# Patient Record
Sex: Female | Born: 1950 | Hispanic: No | Marital: Married | Smoking: Never smoker
Health system: Southern US, Community
[De-identification: ages and names within clinical notes are randomized; demographics above are authoritative.]

## PROBLEM LIST (undated history)

## (undated) DIAGNOSIS — E785 Hyperlipidemia, unspecified: Secondary | ICD-10-CM

## (undated) DIAGNOSIS — T7840XA Allergy, unspecified, initial encounter: Secondary | ICD-10-CM

## (undated) DIAGNOSIS — I10 Essential (primary) hypertension: Secondary | ICD-10-CM

## (undated) DIAGNOSIS — M1990 Unspecified osteoarthritis, unspecified site: Secondary | ICD-10-CM

## (undated) DIAGNOSIS — K219 Gastro-esophageal reflux disease without esophagitis: Secondary | ICD-10-CM

## (undated) HISTORY — DX: Hyperlipidemia, unspecified: E78.5

## (undated) HISTORY — DX: Allergy, unspecified, initial encounter: T78.40XA

## (undated) HISTORY — PX: TONSILLECTOMY: SUR1361

## (undated) HISTORY — PX: APPENDECTOMY: SHX54

## (undated) HISTORY — DX: Unspecified osteoarthritis, unspecified site: M19.90

## (undated) HISTORY — DX: Gastro-esophageal reflux disease without esophagitis: K21.9

## (undated) HISTORY — DX: Essential (primary) hypertension: I10

---

## 1972-12-18 HISTORY — PX: APPENDECTOMY: SHX54

## 1975-12-19 HISTORY — PX: TONSILLECTOMY: SUR1361

## 2016-01-22 ENCOUNTER — Emergency Department
Admission: EM | Admit: 2016-01-22 | Discharge: 2016-01-22 | Disposition: A | Discharge status: Home / Self Care | Payer: Managed Care, Other (non HMO) | Attending: Emergency Medicine | Admitting: Emergency Medicine

## 2016-01-22 ENCOUNTER — Emergency Department: Payer: Managed Care, Other (non HMO)

## 2016-01-22 ENCOUNTER — Encounter: Payer: Self-pay | Admitting: Emergency Medicine

## 2016-01-22 DIAGNOSIS — J069 Acute upper respiratory infection, unspecified: Secondary | ICD-10-CM

## 2016-01-22 DIAGNOSIS — J029 Acute pharyngitis, unspecified: Secondary | ICD-10-CM | POA: Diagnosis present

## 2016-01-22 DIAGNOSIS — Z880 Allergy status to penicillin: Secondary | ICD-10-CM | POA: Diagnosis not present

## 2016-01-22 LAB — POCT RAPID STREP A: Streptococcus, Group A Screen (Direct): NEGATIVE

## 2016-01-22 LAB — RAPID INFLUENZA A&B ANTIGENS
Influenza A (ARMC): NOT DETECTED
Influenza B (ARMC): NOT DETECTED

## 2016-01-22 MED ORDER — OSELTAMIVIR PHOSPHATE 75 MG PO CAPS: 75.0000 mg | ORAL_CAPSULE | Freq: Two times a day (BID) | ORAL | Status: DC

## 2016-01-22 MED ORDER — ACETAMINOPHEN 325 MG PO TABS
ORAL_TABLET | ORAL | Status: AC
  Administered 2016-01-22: 650 mg via ORAL
  Filled 2016-01-22: qty 2

## 2016-01-22 MED ORDER — ACETAMINOPHEN 325 MG PO TABS
650.0000 mg | ORAL_TABLET | Freq: Once | ORAL | Status: AC | PRN
  Administered 2016-01-22: 650 mg via ORAL

## 2016-01-22 MED ORDER — AZITHROMYCIN 250 MG PO TABS
ORAL_TABLET | ORAL | Status: DC
Start: 2016-01-22 — End: 2020-12-08

## 2016-01-22 NOTE — ED Notes (Signed)
Patient presents to the ED with fever, cough, congestion and body aches x 2 days.  Patient states she had the flu about 2-3 weeks ago and had recovered but is now experiencing the same symptoms she was before.  Patient is alert and oriented at this time.  Ambulatory to triage.  Respirations are even and nonlabored.  Patient is complaining of painful cough.

## 2016-01-22 NOTE — ED Provider Notes (Signed)
[facility] Emergency Department Provider Note  ____________________________________________  Time seen: Approximately 8:06 PM  I have reviewed the triage vital signs and the nursing notes.   HISTORY  Chief Complaint Influenza    HPI Amber Burns is a 65 y.o. female presents with acute onset of sore throat cough, myalgias and fevers. She had a similar infection approximately 3 weeks ago that resolved with antibiotics. However her symptoms currently appear worse to the patient. She has some shortness of breath and is fatigued. No abdominal pain or vomiting. She has a headache. No history of smoking, lung disease or asthma.   History reviewed. No pertinent past medical history.  There are no active problems to display for this patient.   Past Surgical History  Procedure Laterality Date  . Tonsillectomy    . Appendectomy      Current Outpatient Rx  Name  Route  Sig  Dispense  Refill  . azithromycin (ZITHROMAX Z-PAK) 250 MG tablet      Take 2 tablets (500 mg) on  Day 1,  followed by 1 tablet (250 mg) once daily on Days 2 through 5.   6 each   0   . oseltamivir (TAMIFLU) 75 MG capsule   Oral   Take 1 capsule (75 mg total) by mouth 2 (two) times daily.   10 capsule   0     Allergies Aspirin; Ibuprofen; and Penicillins  No family history on file.  Social History Social History  Substance Use Topics  . Smoking status: Never Smoker   . Smokeless tobacco: None  . Alcohol Use: No    Review of Systems Constitutional: No fever/chills Eyes: No visual changes. ENT: PER HPI Cardiovascular: Denies chest pain. Respiratory: MILD shortness of breath. Gastrointestinal: No abdominal pain.  No nausea, no vomiting.  No diarrhea.  No constipation. Genitourinary: Negative for dysuria. Musculoskeletal: Negative for back pain. Skin: Negative for rash. Neurological: Negative for focal weakness or numbness. 10-point ROS otherwise  negative.  ____________________________________________   PHYSICAL EXAM:  VITAL SIGNS: ED Triage Vitals  Enc Vitals Group     BP 01/22/16 1907 145/74 mmHg     Pulse Rate 01/22/16 1907 91     Resp 01/22/16 1907 20     Temp 01/22/16 1907 100.2 F (37.9 C)     Temp Source 01/22/16 1907 Oral     SpO2 01/22/16 1907 95 %     Weight 01/22/16 1850 180 lb (81.647 kg)     Height 01/22/16 1850  (1.702 m)     Head Cir --      Peak Flow --      Pain Score 01/22/16 1850 7     Pain Loc --      Pain Edu? --      Excl. in GC? --     Constitutional: Alert and oriented. Well appearing and in no acute distress. Eyes: Conjunctivae are normal. PERRL. EOMI. Ears:  Clear with normal landmarks. No erythema. Head: Atraumatic. Nose: No congestion/rhinnorhea. Mouth/Throat: Mucous membranes are moist.  Oropharynx non-erythematous. No lesions. Neck:  Supple.  No adenopathy.   Cardiovascular: Normal rate, regular rhythm. Grossly normal heart sounds.  Good peripheral circulation. Respiratory: Normal respiratory effort.  No retractions. Lungs CTAB. Gastrointestinal: Soft and nontender. No distention. No abdominal bruits. No CVA tenderness. Musculoskeletal: Nml ROM of upper and lower extremity joints. Neurologic:  Normal speech and language. No gross focal neurologic deficits are appreciated. No gait instability. Skin:  Skin is warm, dry  and intact. No rash noted. Psychiatric: Mood and affect are normal. Speech and behavior are normal.  ____________________________________________   LABS (all labs ordered are listed, but only abnormal results are displayed)  Labs Reviewed  RAPID INFLUENZA A&B ANTIGENS ([facility] ONLY)  POCT RAPID STREP A   ____________________________________________  EKG   ____________________________________________  RADIOLOGY  CLINICAL DATA: Patient presents to the ED with fever, cough, congestion and body aches x 2 days. Patient states she had the flu about 2-3 weeks  ago and had recovered but is now experiencing the same symptoms she was before. Cough.  EXAM: CHEST 2 VIEW  COMPARISON: None.  FINDINGS: The heart size and mediastinal contours are within normal limits. Both lungs are clear. The visualized skeletal structures are unremarkable.  IMPRESSION: No active cardiopulmonary disease.   Electronically Signed  By: Norva Pavlov M.D.  On: 01/22/2016 20:16  ____________________________________________   PROCEDURES  Procedure(s) performed: None  Critical Care performed: No  ____________________________________________   INITIAL IMPRESSION / ASSESSMENT AND PLAN / ED COURSE  Pertinent labs & imaging results that were available during my care of the patient were reviewed by me and considered in my medical decision making (see chart for details).  65 year old female with acute onset of respiratory symptoms consistent with a viral URI.    negative flu test and chest x-ray. However I did discuss taking Tamiflu with the patient because of a possibility still of flu virus. She is also given Zithromax to take if symptoms are worsening after 5-7 days. She may return to emergency room for any concerns. ____________________________________________   FINAL CLINICAL IMPRESSION(S) / ED DIAGNOSES  Final diagnoses:  URI (upper respiratory infection)      Ignacia Bayley, PA-C 01/22/16 2119  Sharyn Creamer, MD 01/22/16 2250

## 2016-01-22 NOTE — Discharge Instructions (Signed)
Upper Respiratory Infection, Adult Most upper respiratory infections (URIs) are a viral infection of the air passages leading to the lungs. A URI affects the nose, throat, and upper air passages. The most common type of URI is nasopharyngitis and is typically referred to as "the common cold." URIs run their course and usually go away on their own. Most of the time, a URI does not require medical attention, but sometimes a bacterial infection in the upper airways can follow a viral infection. This is called a secondary infection. Sinus and middle ear infections are common types of secondary upper respiratory infections. Bacterial pneumonia can also complicate a URI. A URI can worsen asthma and chronic obstructive pulmonary disease (COPD). Sometimes, these complications can require emergency medical care and may be life threatening.  CAUSES Almost all URIs are caused by viruses. A virus is a type of germ and can spread from one person to another.  RISKS FACTORS You may be at risk for a URI if:   You smoke.   You have chronic heart or lung disease.  You have a weakened defense (immune) system.   You are very young or very old.   You have nasal allergies or asthma.  You work in crowded or poorly ventilated areas.  You work in health care facilities or schools. SIGNS AND SYMPTOMS  Symptoms typically develop 2-3 days after you come in contact with a cold virus. Most viral URIs last 7-10 days. However, viral URIs from the influenza virus (flu virus) can last 14-18 days and are typically more severe. Symptoms may include:   Runny or stuffy (congested) nose.   Sneezing.   Cough.   Sore throat.   Headache.   Fatigue.   Fever.   Loss of appetite.   Pain in your forehead, behind your eyes, and over your cheekbones (sinus pain).  Muscle aches.  DIAGNOSIS  Your health care provider may diagnose a URI by:  Physical exam.  Tests to check that your symptoms are not due to  another condition such as:  Strep throat.  Sinusitis.  Pneumonia.  Asthma. TREATMENT  A URI goes away on its own with time. It cannot be cured with medicines, but medicines may be prescribed or recommended to relieve symptoms. Medicines may help:  Reduce your fever.  Reduce your cough.  Relieve nasal congestion. HOME CARE INSTRUCTIONS   Take medicines only as directed by your health care provider.   Gargle warm saltwater or take cough drops to comfort your throat as directed by your health care provider.  Use a warm mist humidifier or inhale steam from a shower to increase air moisture. This may make it easier to breathe.  Drink enough fluid to keep your urine clear or pale yellow.   Eat soups and other clear broths and maintain good nutrition.   Rest as needed.   Return to work when your temperature has returned to normal or as your health care provider advises. You may need to stay home longer to avoid infecting others. You can also use a face mask and careful hand washing to prevent spread of the virus.  Increase the usage of your inhaler if you have asthma.   Do not use any tobacco products, including cigarettes, chewing tobacco, or electronic cigarettes. If you need help quitting, ask your health care provider. PREVENTION  The best way to protect yourself from getting a cold is to practice good hygiene.   Avoid oral or hand contact with people with cold  symptoms.   Wash your hands often if contact occurs.  There is no clear evidence that vitamin C, vitamin E, echinacea, or exercise reduces the chance of developing a cold. However, it is always recommended to get plenty of rest, exercise, and practice good nutrition.  SEEK MEDICAL CARE IF:   You are getting worse rather than better.   Your symptoms are not controlled by medicine.   You have chills.  You have worsening shortness of breath.  You have brown or red mucus.  You have yellow or brown nasal  discharge.  You have pain in your face, especially when you bend forward.  You have a fever.  You have swollen neck glands.  You have pain while swallowing.  You have white areas in the back of your throat. SEEK IMMEDIATE MEDICAL CARE IF:   You have severe or persistent:  Headache.  Ear pain.  Sinus pain.  Chest pain.  You have chronic lung disease and any of the following:  Wheezing.  Prolonged cough.  Coughing up blood.  A change in your usual mucus.  You have a stiff neck.  You have changes in your:  Vision.  Hearing.  Thinking.  Mood. MAKE SURE YOU:   Understand these instructions.  Will watch your condition.  Will get help right away if you are not doing well or get worse.   This information is not intended to replace advice given to you by your health care provider. Make sure you discuss any questions you have with your health care provider.   Document Released: 05/30/2001 Document Revised: 04/20/2015 Document Reviewed: 03/11/2014 Elsevier Interactive Patient Education 2016 [employer].   You have symptoms consistent with a viral respiratory infection. This still may be a flu virus and you may benefit from Tamiflu. If symptoms are worsening over the next 5-7 days he may try an antibiotic as well. Feel free to return to the emergency room for any concerns, or follow-up with your physician.

## 2016-11-21 ENCOUNTER — Other Ambulatory Visit: Payer: Self-pay | Admitting: Physician Assistant

## 2016-11-21 DIAGNOSIS — R928 Other abnormal and inconclusive findings on diagnostic imaging of breast: Secondary | ICD-10-CM

## 2016-11-22 ENCOUNTER — Inpatient Hospital Stay
Admission: RE | Admit: 2016-11-22 | Discharge: 2016-11-22 | Disposition: A | Discharge status: Home / Self Care | Payer: Self-pay | Source: Ambulatory Visit | Attending: *Deleted | Admitting: *Deleted

## 2016-11-22 ENCOUNTER — Other Ambulatory Visit: Payer: Self-pay | Admitting: *Deleted

## 2016-11-22 DIAGNOSIS — Z9289 Personal history of other medical treatment: Secondary | ICD-10-CM

## 2016-11-23 ENCOUNTER — Other Ambulatory Visit: Payer: Self-pay | Admitting: Physician Assistant

## 2016-11-23 DIAGNOSIS — R928 Other abnormal and inconclusive findings on diagnostic imaging of breast: Secondary | ICD-10-CM

## 2017-12-03 IMAGING — CR DG CHEST 2V
1 series · 2 of 2 positions shown · non-contrast
Comparison: None.

CLINICAL DATA: Patient presents to the ED with fever, cough,
congestion and body aches x 2 days. Patient states she had the flu
about 2-3 weeks ago and had recovered but is now experiencing the
same symptoms she was before. Cough.

EXAM:
CHEST  2 VIEW

[Series 1: dg chest 2 view · 0.14mm/px · 2 of 2 slices shown]
[im 1/2]
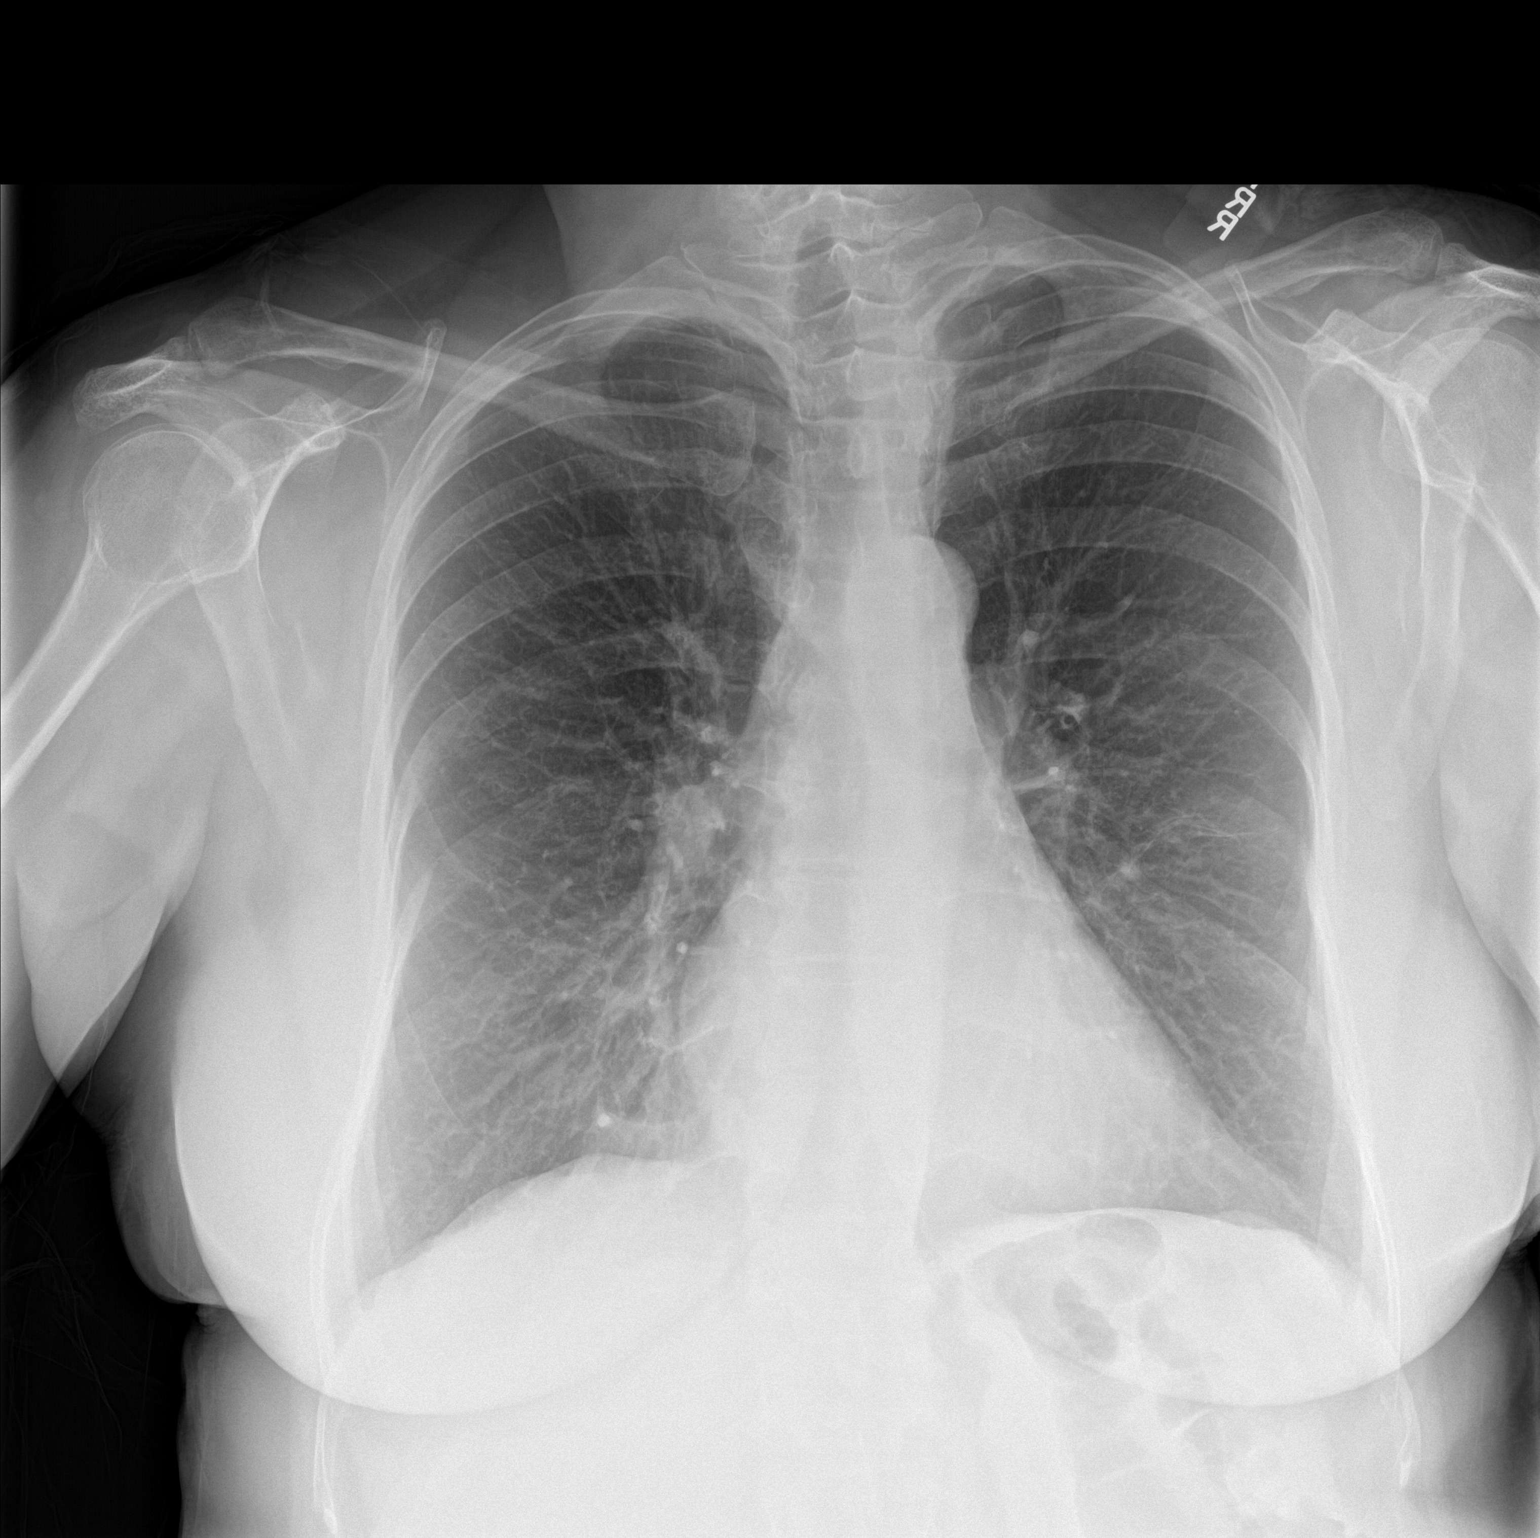
[im 2/2]
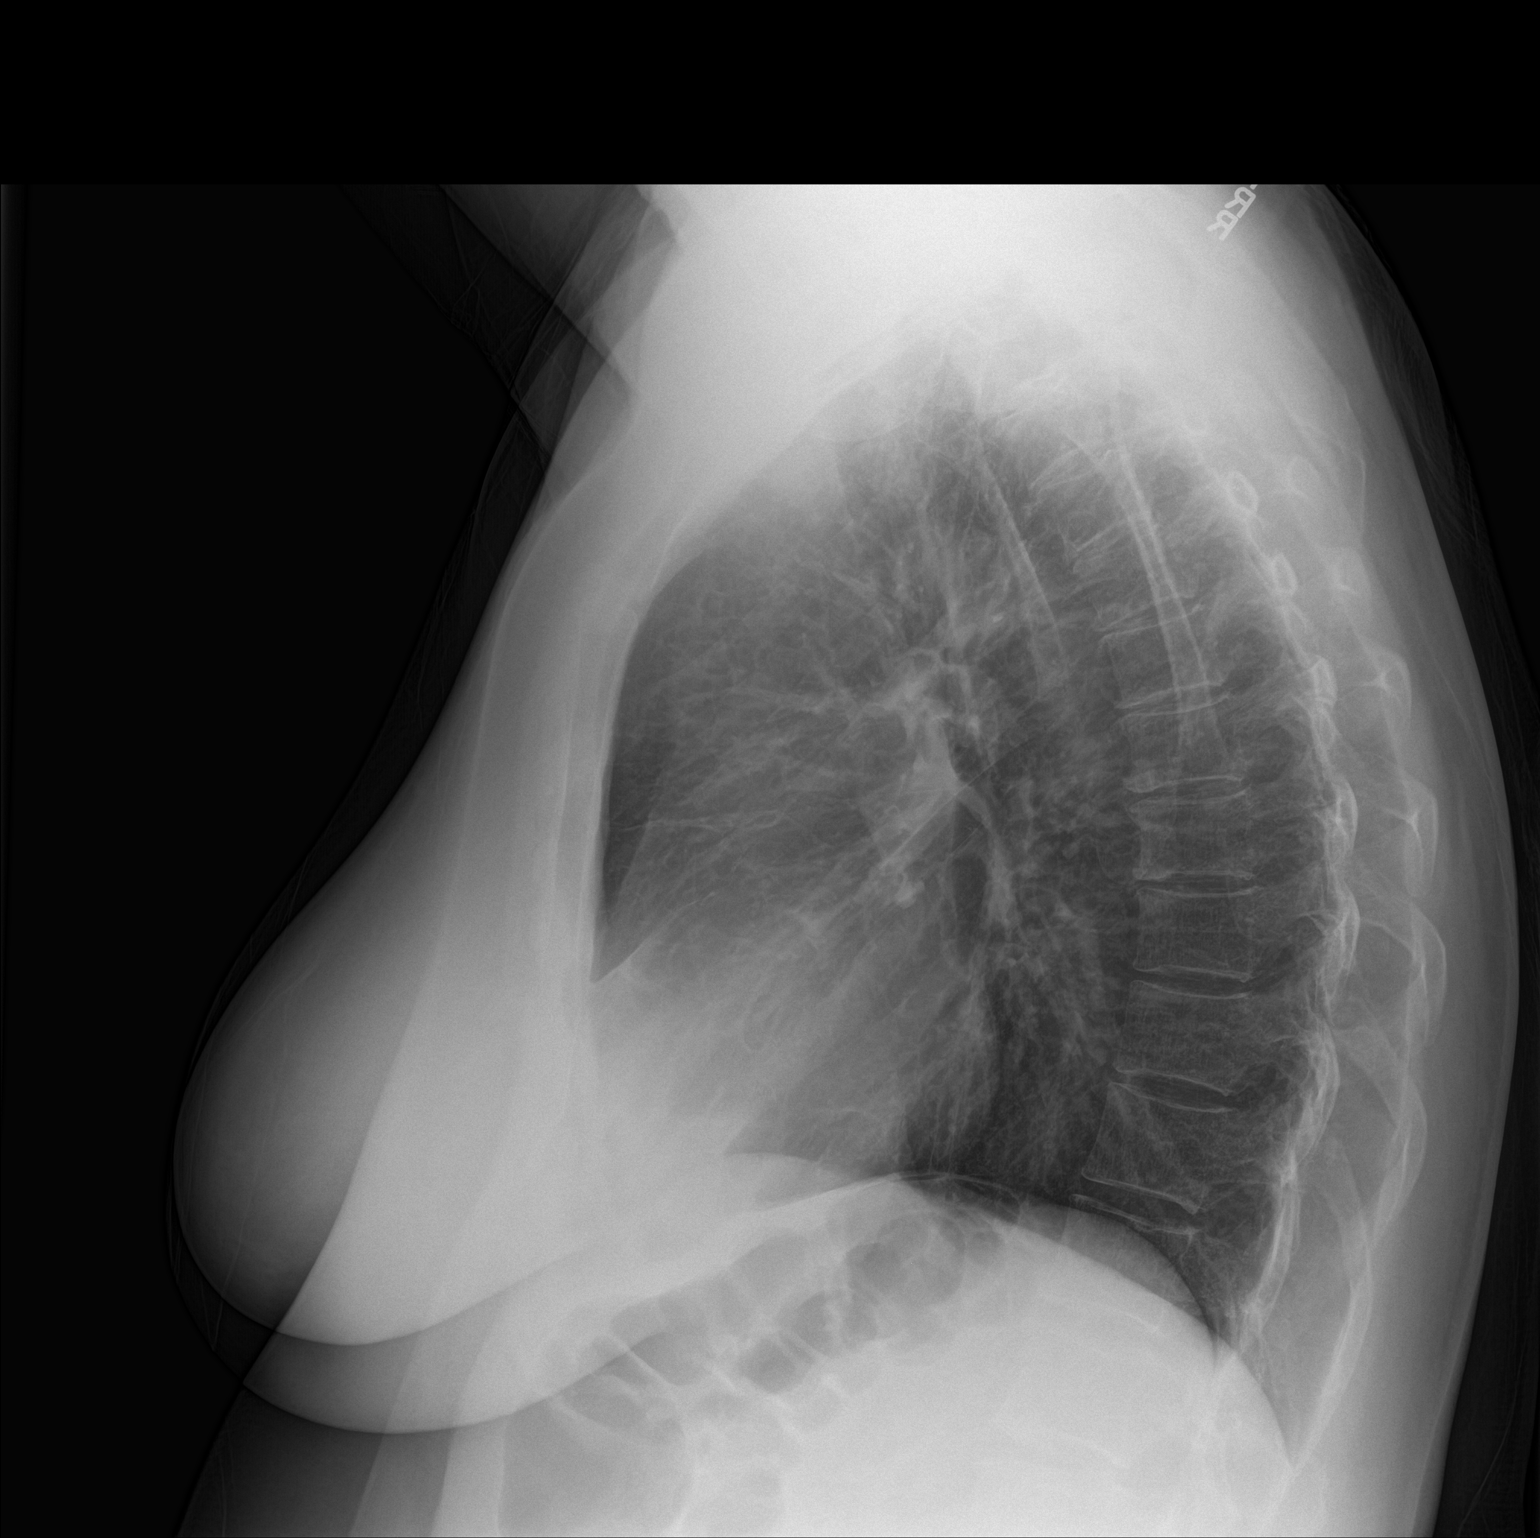

[2 of 2 positions shown; findings below may reference images not displayed]

FINDINGS: The heart size and mediastinal contours are within normal limits.
Both lungs are clear. The visualized skeletal structures are
unremarkable.
IMPRESSION: No active cardiopulmonary disease.

## 2020-10-01 ENCOUNTER — Ambulatory Visit (INDEPENDENT_AMBULATORY_CARE_PROVIDER_SITE_OTHER): Payer: Managed Care, Other (non HMO) | Admitting: Family Medicine

## 2020-10-01 ENCOUNTER — Other Ambulatory Visit: Payer: Self-pay

## 2020-10-01 ENCOUNTER — Encounter: Payer: Self-pay | Admitting: Family Medicine

## 2020-10-01 VITALS — BP 132/82 | HR 81 | Temp 98.3°F | Ht 65.0 in | Wt 193.5 lb

## 2020-10-01 DIAGNOSIS — Z7689 Persons encountering health services in other specified circumstances: Secondary | ICD-10-CM

## 2020-10-01 DIAGNOSIS — Z6832 Body mass index (BMI) 32.0-32.9, adult: Secondary | ICD-10-CM

## 2020-10-01 DIAGNOSIS — E6609 Other obesity due to excess calories: Secondary | ICD-10-CM | POA: Diagnosis not present

## 2020-10-01 DIAGNOSIS — Z789 Other specified health status: Secondary | ICD-10-CM | POA: Diagnosis not present

## 2020-10-01 DIAGNOSIS — E7841 Elevated Lipoprotein(a): Secondary | ICD-10-CM

## 2020-10-01 DIAGNOSIS — Z5320 Procedure and treatment not carried out because of patient's decision for unspecified reasons: Secondary | ICD-10-CM

## 2020-10-01 NOTE — Progress Notes (Signed)
Subjective:    Patient ID: Amber Burns, female    DOB: 25-Nov-1951, 69 y.o.   MRN: [id number]  HPI Chief Complaint  Patient presents with  . New Patient (Initial Visit)   This is a 69 yo female who presents today to establish care. 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  Accompanied by her husband who is also establishing care. She retired in August from [employer]. Working on self care. Enjoys gardening.   Last CPE- couple of years Mammo- declines has had one a number of years ago and found it to be painful. Pap- many years ago, declines, all normal per patient Colonoscopy-  Will consider, declines currently Tdap- unknown, declines Flu- declines Covid 19 vaccine- will consider Eye- years ago Dental- pre covid Exercise- not anything at this time Diet-patient declined answering questions regarding her diet and reports that she "knows what to do."   She indicates that she was under a great deal of stress while working and since retiring is focusing more on her health and wellness.  She brings labs with her that were done for employee screening at work.  Mildly elevated lipids noted  Review of Systems No headaches, visual change, chest pain, SOB, abdominal pain, nausea, vomiting, diarrhea, dysuria, frequency, + urge incontinence, nocturia, vaginal itching/ bleeding/ discharge. + arthritis    Objective:   Physical Exam Physical Exam  Constitutional: Oriented to person, place, and time. Appears well-developed and well-nourished.  HENT:  Head: Normocephalic and atraumatic.  Eyes: Conjunctivae are normal.  Neck: Normal range of motion. Neck supple.  Cardiovascular: Normal rate, regular rhythm and normal heart sounds.   Pulmonary/Chest: Effort normal and breath sounds normal.  Musculoskeletal: No lower extremity edema.   Neurological: Alert and oriented to person, place, and time.  Skin: Skin is warm and dry.  Psychiatric: Normal mood and  affect. Behavior is normal. Judgment and thought content normal.  Vitals reviewed.     BP 132/82   Pulse 81   Temp 98.3 F (36.8 C) (Temporal)   Ht 5\' 5"  (1.651 m)   Wt 193 lb 8 oz (87.8 kg)   SpO2 95%   BMI 32.20 kg/m  Depression screen [facility] 2/9 10/01/2020  Decreased Interest 0  Down, Depressed, Hopeless 0  PHQ - 2 Score 0       Assessment & Plan:  1. Encounter to establish care -Available records reviewed, will request records from prior PCP  2. Class 1 obesity due to excess calories without serious comorbidity with body mass index (BMI) of 32.0 to 32.9 in adult -Encouraged her to eat a diet high in vegetables, lean proteins, fruits, increase her activity level - Hemoglobin A1c - TSH  3. Elevated lipoprotein(a) - Hemoglobin A1c - TSH  4. Health maintenance alteration -Reviewed recommended screenings with the patient and encouraged her to schedule appointment for follow-up complete physical exam in 3 to 4 months and we can further discuss recommended screenings  5. Mammogram declined -Advised patient to schedule appointment for complete physical exam for clinical breast exam and rectal reminded her that she can get scheduled for mammogram at any time if she changes her mind  6. Colon cancer screening declined -Discussed importance of early detection of colorectal cancer and advised on various methods of testing including colonoscopy, Cologuard and FOBT.  Patient reports that she will think about her options  7. Pap smear of cervix declined -Patient declines at this time  This visit occurred  during the SARS-CoV-2 public health emergency.  Safety protocols were in place, including screening questions prior to the visit, additional usage of staff PPE, and extensive cleaning of exam room while observing appropriate contact time as indicated for disinfecting solutions.      Olean Ree, FNP-BC  [facility], [facility]  10/05/2020 9:14 AM

## 2020-10-01 NOTE — Patient Instructions (Signed)
Good to meet you today  Please schedule your complete physical for 3-4 months, will recheck your cholesterol at that time  Consider Tdap, influenza vaccine, colonoscopy, mammogram, shingles vaccine  Continue to work on healthy food choices, increased exercise

## 2020-10-02 ENCOUNTER — Encounter: Payer: Self-pay | Admitting: Family Medicine

## 2020-10-02 LAB — HEMOGLOBIN A1C
Est. average glucose Bld gHb Est-mCnc: 128 mg/dL
Hgb A1c MFr Bld: 6.1 % — ABNORMAL HIGH (ref 4.8–5.6)

## 2020-10-02 LAB — TSH: TSH: 1.16 u[IU]/mL (ref 0.450–4.500)

## 2020-10-08 ENCOUNTER — Ambulatory Visit: Payer: Managed Care, Other (non HMO) | Admitting: Family Medicine

## 2020-12-08 ENCOUNTER — Other Ambulatory Visit: Payer: Self-pay | Admitting: Family Medicine

## 2020-12-08 DIAGNOSIS — E7841 Elevated Lipoprotein(a): Secondary | ICD-10-CM | POA: Insufficient documentation

## 2020-12-08 DIAGNOSIS — R7303 Prediabetes: Secondary | ICD-10-CM | POA: Insufficient documentation

## 2020-12-24 ENCOUNTER — Other Ambulatory Visit: Payer: Managed Care, Other (non HMO)

## 2020-12-31 ENCOUNTER — Encounter: Payer: Managed Care, Other (non HMO) | Admitting: Family Medicine

## 2021-01-13 ENCOUNTER — Telehealth: Payer: Self-pay | Admitting: Family Medicine

## 2021-01-13 NOTE — Telephone Encounter (Signed)
Called patient and could not talk at the time I called. Told her to call back when she could. Needs to schedule TOC. EM

## 2021-01-25 ENCOUNTER — Other Ambulatory Visit: Payer: Self-pay | Admitting: Internal Medicine

## 2021-01-25 ENCOUNTER — Ambulatory Visit: Payer: Managed Care, Other (non HMO) | Attending: Internal Medicine

## 2021-01-25 DIAGNOSIS — Z23 Encounter for immunization: Secondary | ICD-10-CM

## 2021-01-25 NOTE — Progress Notes (Signed)
   Covid-19 Vaccination Clinic  Name:  Amber Burns    MRN: [id number] DOB: 07/16/1951  01/25/2021  [name] was observed post Covid-19 immunization for 15 minutes without incident. She was provided with Vaccine Information Sheet and instruction to access the V-Safe system.   [name] was instructed to call 911 with any severe reactions post vaccine: Marland Kitchen Difficulty breathing  . Swelling of face and throat  . A fast heartbeat  . A bad rash all over body  . Dizziness and weakness   Immunizations Administered    Name Date Dose VIS Date Route   JANSSEN COVID-19 VACCINE 01/25/2021  1:22 PM 0.5 mL 10/06/2020 Intramuscular   Manufacturer: [employer]   Lot: [id number]   NDC: [phone]

## 2021-06-22 ENCOUNTER — Telehealth: Payer: Self-pay

## 2021-06-22 NOTE — Telephone Encounter (Signed)
See note for husband as well.   I  strongly prefer to have her establish with new provider  coming in as well as husband.  If husband disappointed/upset and request TOC with me... I will accept wife.

## 2021-06-22 NOTE — Telephone Encounter (Signed)
Patient's husband, said he saw Dr Ermalene Searing earlier this year and was told that she was taking new patients/TOC patients, wanted to make sure Dr Ermalene Searing could still see her as a patient? PCP was already changed to Dr Ermalene Searing by someone but no actual notes in the chart to note this.  Thank you

## 2022-04-14 ENCOUNTER — Ambulatory Visit (INDEPENDENT_AMBULATORY_CARE_PROVIDER_SITE_OTHER): Payer: Medicare Other | Admitting: Family Medicine

## 2022-04-14 ENCOUNTER — Encounter: Payer: Self-pay | Admitting: Family Medicine

## 2022-04-14 VITALS — BP 140/80 | HR 78 | Temp 98.1°F | Ht 65.25 in | Wt 195.4 lb

## 2022-04-14 DIAGNOSIS — R0609 Other forms of dyspnea: Secondary | ICD-10-CM | POA: Diagnosis not present

## 2022-04-14 DIAGNOSIS — Z1159 Encounter for screening for other viral diseases: Secondary | ICD-10-CM | POA: Diagnosis not present

## 2022-04-14 DIAGNOSIS — I10 Essential (primary) hypertension: Secondary | ICD-10-CM

## 2022-04-14 DIAGNOSIS — K219 Gastro-esophageal reflux disease without esophagitis: Secondary | ICD-10-CM

## 2022-04-14 DIAGNOSIS — Z8619 Personal history of other infectious and parasitic diseases: Secondary | ICD-10-CM | POA: Diagnosis not present

## 2022-04-14 DIAGNOSIS — R5383 Other fatigue: Secondary | ICD-10-CM

## 2022-04-14 DIAGNOSIS — K2940 Chronic atrophic gastritis without bleeding: Secondary | ICD-10-CM | POA: Diagnosis not present

## 2022-04-14 DIAGNOSIS — R7303 Prediabetes: Secondary | ICD-10-CM | POA: Diagnosis not present

## 2022-04-14 LAB — CBC WITH DIFFERENTIAL/PLATELET
Basophils Absolute: 0 10*3/uL (ref 0.0–0.1)
Basophils Relative: 0.5 % (ref 0.0–3.0)
Eosinophils Absolute: 0.1 10*3/uL (ref 0.0–0.7)
Eosinophils Relative: 1.7 % (ref 0.0–5.0)
HCT: 40.6 % (ref 36.0–46.0)
Hemoglobin: 13.5 g/dL (ref 12.0–15.0)
Lymphocytes Relative: 36.5 % (ref 12.0–46.0)
Lymphs Abs: 1.8 10*3/uL (ref 0.7–4.0)
MCHC: 33.2 g/dL (ref 30.0–36.0)
MCV: 88.4 fl (ref 78.0–100.0)
Monocytes Absolute: 0.3 10*3/uL (ref 0.1–1.0)
Monocytes Relative: 6.4 % (ref 3.0–12.0)
Neutro Abs: 2.8 10*3/uL (ref 1.4–7.7)
Neutrophils Relative %: 54.9 % (ref 43.0–77.0)
Platelets: 289 10*3/uL (ref 150.0–400.0)
RBC: 4.59 Mil/uL (ref 3.87–5.11)
RDW: 13.5 % (ref 11.5–15.5)
WBC: 5 10*3/uL (ref 4.0–10.5)

## 2022-04-14 LAB — COMPREHENSIVE METABOLIC PANEL
ALT: 20 U/L (ref 0–35)
AST: 17 U/L (ref 0–37)
Albumin: 4.6 g/dL (ref 3.5–5.2)
Alkaline Phosphatase: 71 U/L (ref 39–117)
BUN: 16 mg/dL (ref 6–23)
CO2: 31 mEq/L (ref 19–32)
Calcium: 9.4 mg/dL (ref 8.4–10.5)
Chloride: 103 mEq/L (ref 96–112)
Creatinine, Ser: 0.62 mg/dL (ref 0.40–1.20)
GFR: 91.55 mL/min (ref >60.00)
Glucose, Bld: 92 mg/dL (ref 70–99)
Potassium: 4.1 mEq/L (ref 3.5–5.1)
Sodium: 141 mEq/L (ref 135–145)
Total Bilirubin: 0.7 mg/dL (ref 0.2–1.2)
Total Protein: 6.8 g/dL (ref 6.0–8.3)

## 2022-04-14 LAB — HEMOGLOBIN A1C: Hgb A1c MFr Bld: 6.3 % (ref 4.6–6.5)

## 2022-04-14 LAB — LIPID PANEL
Cholesterol: 271 mg/dL — ABNORMAL HIGH (ref 0–200)
HDL: 58.1 mg/dL (ref >39.00)
LDL Cholesterol: 183 mg/dL — ABNORMAL HIGH (ref 0–99)
NonHDL: 212.5
Total CHOL/HDL Ratio: 5
Triglycerides: 146 mg/dL (ref 0.0–149.0)
VLDL: 29.2 mg/dL (ref 0.0–40.0)

## 2022-04-14 LAB — TSH: TSH: 1.61 u[IU]/mL (ref 0.35–5.50)

## 2022-04-14 LAB — VITAMIN B12: Vitamin B-12: 180 pg/mL — ABNORMAL LOW (ref 211–911)

## 2022-04-14 MED ORDER — LOSARTAN POTASSIUM-HCTZ 50-12.5 MG PO TABS: 1.0000 | ORAL_TABLET | Freq: Every day | ORAL | 1 refills | Status: DC

## 2022-04-14 NOTE — Progress Notes (Signed)
Patient ID: Amber Burns, female    DOB: [birth year], 71 y.o.   MRN: [id number]  This visit was conducted in person.  BP 140/80   Pulse 78   Temp 98.1 F (36.7 C) (Oral)   Ht 5' 5.25" (1.657 m)   Wt 195 lb 7 oz (88.6 kg)   SpO2 95%   BMI 32.27 kg/m    CC:  Chief Complaint  Patient presents with   Establish Care    TOC from D. Gessner    Subjective:   HPI: Amber CoombesKatherine Hosterman is a 71 y.o. female presenting on 04/14/2022 for Establish Care (TOC from D. Leone PayorGessner)   PCP: Deboraha Sprangebbie Gessner  Last CPX: 2021  Prediabetes :  Lab Results  Component Value Date   HGBA1C 6.1 (H) 10/01/2020     Hypertension: Stable control on losartan HCTZ Using medication without problems or lightheadedness:  none Chest pain with exertion: none Edema: none Short of breath:  Occ SOB with exertion, ongoing x 2 years. See below Average home BPs: 130/80 Other issues: BP Readings from Last 3 Encounters:  04/14/22 140/80  10/01/20 132/82  01/22/16 (!) 145/74   No cough, no wheeze  Notes more SOB breathing through nose.    She is very active during the day.  Getting very tired  Some issue with swelling in nose.  Tried flonase.. no help.  Wt Readings from Last 3 Encounters:  04/14/22 195 lb 7 oz (88.6 kg)  10/01/20 193 lb 8 oz (87.8 kg)  01/22/16 180 lb (81.6 kg)   Body mass index is 32.27 kg/m.   She has low back pain.. worse with activity.Marland Kitchen. this limit  No radiation of pain  No numbness or weakness.  Nonsmoker  Relevant past medical, surgical, family and social history reviewed and updated as indicated. Interim medical history since our last visit reviewed. Allergies and medications reviewed and updated. Outpatient Medications Prior to Visit  Medication Sig Dispense Refill   losartan-hydrochlorothiazide (HYZAAR) 50-12.5 MG tablet Take 1 tablet by mouth daily.     No facility-administered medications prior to visit.     Per HPI unless specifically indicated in ROS  section below Review of Systems  Constitutional:  Negative for fatigue and fever.  HENT:  Negative for congestion.   Eyes:  Negative for pain.  Respiratory:  Negative for cough and shortness of breath.   Cardiovascular:  Negative for chest pain, palpitations and leg swelling.  Gastrointestinal:  Negative for abdominal pain.  Genitourinary:  Negative for dysuria and vaginal bleeding.  Musculoskeletal:  Positive for back pain.  Neurological:  Negative for syncope, light-headedness and headaches.  Psychiatric/Behavioral:  Negative for dysphoric mood.   Objective:  BP 140/80   Pulse 78   Temp 98.1 F (36.7 C) (Oral)   Ht 5' 5.25" (1.657 m)   Wt 195 lb 7 oz (88.6 kg)   SpO2 95%   BMI 32.27 kg/m   Wt Readings from Last 3 Encounters:  04/14/22 195 lb 7 oz (88.6 kg)  10/01/20 193 lb 8 oz (87.8 kg)  01/22/16 180 lb (81.6 kg)      Physical Exam Vitals and nursing note reviewed.  Constitutional:      General: She is not in acute distress.    Appearance: Normal appearance. She is well-developed. She is not ill-appearing or toxic-appearing.  HENT:     Head: Normocephalic.     Right Ear: Hearing, tympanic membrane, ear canal and external ear normal.     Left Ear:  Hearing, tympanic membrane, ear canal and external ear normal.     Nose: Nose normal.  Eyes:     General: Lids are normal. Lids are everted, no foreign bodies appreciated.     Conjunctiva/sclera: Conjunctivae normal.     Pupils: Pupils are equal, round, and reactive to light.  Neck:     Thyroid: No thyroid mass or thyromegaly.     Vascular: No carotid bruit.     Trachea: Trachea normal.  Cardiovascular:     Rate and Rhythm: Normal rate and regular rhythm.     Heart sounds: Normal heart sounds, S1 normal and S2 normal. No murmur heard.   No gallop.  Pulmonary:     Effort: Pulmonary effort is normal. No respiratory distress.     Breath sounds: Normal breath sounds. No wheezing, rhonchi or rales.  Abdominal:     General:  Bowel sounds are normal. There is no distension or abdominal bruit.     Palpations: Abdomen is soft. There is no fluid wave or mass.     Tenderness: There is no abdominal tenderness. There is no guarding or rebound.     Hernia: No hernia is present.  Musculoskeletal:     Cervical back: Normal range of motion and neck supple.  Lymphadenopathy:     Cervical: No cervical adenopathy.  Skin:    General: Skin is warm and dry.     Findings: No rash.  Neurological:     Mental Status: She is alert.     Cranial Nerves: No cranial nerve deficit.     Sensory: No sensory deficit.  Psychiatric:        Mood and Affect: Mood is not anxious or depressed.        Speech: Speech normal.        Behavior: Behavior normal. Behavior is cooperative.        Judgment: Judgment normal.      Results for orders placed or performed in visit on 10/01/20  Hemoglobin A1c  Result Value Ref Range   Hgb A1c MFr Bld 6.1 (H) 4.8 - 5.6 %   Est. average glucose Bld gHb Est-mCnc 128 mg/dL  TSH  Result Value Ref Range   TSH 1.160 0.450 - 4.500 uIU/mL    This visit occurred during the SARS-CoV-2 public health emergency.  Safety protocols were in place, including screening questions prior to the visit, additional usage of staff PPE, and extensive cleaning of exam room while observing appropriate contact time as indicated for disinfecting solutions.   COVID 19 screen:  No recent travel or known exposure to COVID19 The patient denies respiratory symptoms of COVID 19 at this time. The importance of social distancing was discussed today.   Assessment and Plan    Problem List Items Addressed This Visit     DOE (dyspnea on exertion)   Relevant Orders   CBC with Differential/Platelet (Completed)   TSH (Completed)   GERD (gastroesophageal reflux disease)     Chronic, moderate control  Using nexium as needed.       History  ofAtrophic gastritis   History of Helicobacter pylori infection   HTN (hypertension), benign     Chronic, stable control on losartan hydrochlorothiazide 50/12.5 mg p.o. daily       Relevant Medications   losartan-hydrochlorothiazide (HYZAAR) 50-12.5 MG tablet   Prediabetes    Reevaluation needed.       Relevant Orders   Hemoglobin A1c (Completed)   Other Visit Diagnoses     Primary  hypertension    -  Primary   Relevant Medications   losartan-hydrochlorothiazide (HYZAAR) 50-12.5 MG tablet   Other Relevant Orders   Lipid panel (Completed)   Comprehensive metabolic panel (Completed)   Other fatigue       Relevant Orders   Vitamin B12 (Completed)   TSH (Completed)   Need for hepatitis C screening test       Relevant Orders   Hepatitis C Antibody (Completed)      Meds ordered this encounter  Medications   losartan-hydrochlorothiazide (HYZAAR) 50-12.5 MG tablet    Sig: Take 1 tablet by mouth daily.    Dispense:  90 tablet    Refill:  1   Orders Placed This Encounter  Procedures   Lipid panel   Hemoglobin A1c   Comprehensive metabolic panel   Vitamin B12   CBC with Differential/Platelet   TSH   Hepatitis C Antibody     Kerby Nora, MD

## 2022-04-14 NOTE — Patient Instructions (Addendum)
Please stop at the lab to have labs drawn. ? Continue nexium daily for another 3-5 weeks. ?

## 2022-04-14 NOTE — Assessment & Plan Note (Signed)
Chronic, moderate control ? Using nexium as needed. ?

## 2022-04-14 NOTE — Progress Notes (Signed)
No critical labs need to be addressed urgently. We will discuss labs in detail at upcoming office visit.   

## 2022-04-16 LAB — HEPATITIS C ANTIBODY
Hepatitis C Ab: NONREACTIVE
SIGNAL TO CUT-OFF: 0.14 (ref <1.00)

## 2022-04-27 ENCOUNTER — Encounter: Payer: Self-pay | Admitting: Family Medicine

## 2022-04-27 ENCOUNTER — Ambulatory Visit (INDEPENDENT_AMBULATORY_CARE_PROVIDER_SITE_OTHER): Payer: Medicare Other | Admitting: Family Medicine

## 2022-04-27 VITALS — BP 124/78 | HR 75 | Ht 65.25 in | Wt 197.7 lb

## 2022-04-27 DIAGNOSIS — Z6832 Body mass index (BMI) 32.0-32.9, adult: Secondary | ICD-10-CM

## 2022-04-27 DIAGNOSIS — Z1211 Encounter for screening for malignant neoplasm of colon: Secondary | ICD-10-CM | POA: Diagnosis not present

## 2022-04-27 DIAGNOSIS — E6609 Other obesity due to excess calories: Secondary | ICD-10-CM

## 2022-04-27 DIAGNOSIS — Z23 Encounter for immunization: Secondary | ICD-10-CM | POA: Diagnosis not present

## 2022-04-27 DIAGNOSIS — E7800 Pure hypercholesterolemia, unspecified: Secondary | ICD-10-CM | POA: Diagnosis not present

## 2022-04-27 DIAGNOSIS — I10 Essential (primary) hypertension: Secondary | ICD-10-CM | POA: Insufficient documentation

## 2022-04-27 DIAGNOSIS — R7303 Prediabetes: Secondary | ICD-10-CM | POA: Diagnosis not present

## 2022-04-27 DIAGNOSIS — E538 Deficiency of other specified B group vitamins: Secondary | ICD-10-CM | POA: Diagnosis not present

## 2022-04-27 NOTE — Addendum Note (Signed)
Addended by: Winn Jock on: 04/27/2022 04:34 PM ? ? Modules accepted: Orders ? ?

## 2022-04-27 NOTE — Patient Instructions (Addendum)
Work low cholesterol diet and low carbohydrate diet. ?Continue regular exercise. ?Return for fasting cholesterol recheck in 3-6 month. ? ? ?

## 2022-04-27 NOTE — Assessment & Plan Note (Signed)
New dx. Recent poor activity.. will increase exercise ans healthy eating , low chol diet. ? re-eval in 23-6 months.. if LDL not at goal consider statin medication. ?

## 2022-04-27 NOTE — Assessment & Plan Note (Signed)
Stable, chronic.  Continue current medication. ? ? ?Losartan HCTZ 50/12.5 mg daily. ?

## 2022-04-27 NOTE — Assessment & Plan Note (Signed)
.   Work on low carbohydrate diet. ?

## 2022-04-27 NOTE — Assessment & Plan Note (Signed)
Chronic, improving  Energy on supplementation ?

## 2022-04-27 NOTE — Progress Notes (Signed)
? ? Patient ID: Amber Burns, female    DOB: September 11, 1951, 71 y.o.   MRN: [id number] ? ?This visit was conducted in person. ? ?BP 124/78   Pulse 75   Ht 5' 5.25" (1.657 m)   Wt 197 lb 11.2 oz (89.7 kg)   SpO2 95%   BMI 32.65 kg/m?   ? ?CC:  ?Chief Complaint  ?Patient presents with  ? Annual Exam  ?  Physical ,  discuss lab results   ? ? ?Subjective:  ? ?HPI: ?Amber Burns is a 71 y.o. female presenting on 04/27/2022 for Annual Exam (Physical ,  discuss lab results ) ? ?The patient presents for annual medicare wellness, complete physical and review of chronic health problems. He/She also has the following acute concerns today: ? ?I have personally reviewed the Medicare Annual Wellness questionnaire and have noted ?1. The patient's medical and social history ?2. Their use of alcohol, tobacco or illicit drugs ?3. Their current medications and supplements ?4. The patient's functional ability including ADL's, fall risks, home safety risks and hearing or visual ?            impairment. ?5. Diet and physical activities ?6. Evidence for depression or mood disorders ?7.         Updated provider list ?Cognitive evaluation was performed and recorded on pt medicare questionnaire form. ?The patients weight, height, BMI and visual acuity have been recorded in the chart   ?I have made referrals, counseling and provided education to the patient based review of the above and I have provided the pt with a written personalized care plan for preventive services.  ? Documentation of this information was scanned into the electronic record under the media tab. ? ?Reviewed labs in detail with patient. ?Prediabetes  ?Lab Results  ?Component Value Date  ? HGBA1C 6.3 04/14/2022  ? ?HTN,  Stable control on losartan HCTZ ?BP Readings from Last 3 Encounters:  ?04/27/22 124/78  ?04/14/22 140/80  ?10/01/20 132/82  ? ? ?Elevated Cholesterol:  LDL above goal, statin indicated ?Lab Results  ?Component Value Date  ? CHOL 271 (H)  04/14/2022  ? HDL 58.10 04/14/2022  ? LDLCALC 183 (H) 04/14/2022  ? TRIG 146.0 04/14/2022  ? CHOLHDL 5 04/14/2022  ?Using medications without problems: ?Muscle aches:  ?Diet compliance: good ?Exercise: none ?Other complaints: ? ? B12 low.. started B12 5000 mcg daily in last week. ? She has noted more energy. ? ? ? ?   ? ?Relevant past medical, surgical, family and social history reviewed and updated as indicated. Interim medical history since our last visit reviewed. ?Allergies and medications reviewed and updated. ?Outpatient Medications Prior to Visit  ?Medication Sig Dispense Refill  ? losartan-hydrochlorothiazide (HYZAAR) 50-12.5 MG tablet Take 1 tablet by mouth daily. 90 tablet 1  ? ?No facility-administered medications prior to visit.  ?  ? ?Per HPI unless specifically indicated in ROS section below ?Review of Systems  ?Constitutional:  Negative for fatigue and fever.  ?HENT:  Negative for congestion.   ?Eyes:  Negative for pain.  ?Respiratory:  Negative for cough and shortness of breath.   ?Cardiovascular:  Negative for chest pain, palpitations and leg swelling.  ?Gastrointestinal:  Negative for abdominal pain.  ?Genitourinary:  Negative for dysuria and vaginal bleeding.  ?Musculoskeletal:  Negative for back pain.  ?Neurological:  Negative for syncope, light-headedness and headaches.  ?Psychiatric/Behavioral:  Negative for dysphoric mood.   ?Objective:  ?BP 124/78   Pulse 75   Ht 5' 5.25" (1.657 m)  Wt 197 lb 11.2 oz (89.7 kg)   SpO2 95%   BMI 32.65 kg/m?   ?Wt Readings from Last 3 Encounters:  ?04/27/22 197 lb 11.2 oz (89.7 kg)  ?04/14/22 195 lb 7 oz (88.6 kg)  ?10/01/20 193 lb 8 oz (87.8 kg)  ?  ?  ?Physical Exam ?Vitals and nursing note reviewed.  ?Constitutional:   ?   General: She is not in acute distress. ?   Appearance: Normal appearance. She is well-developed. She is not ill-appearing or toxic-appearing.  ?HENT:  ?   Head: Normocephalic.  ?   Right Ear: Hearing, tympanic membrane, ear canal and  external ear normal.  ?   Left Ear: Hearing, tympanic membrane, ear canal and external ear normal.  ?   Nose: Nose normal.  ?Eyes:  ?   General: Lids are normal. Lids are everted, no foreign bodies appreciated.  ?   Conjunctiva/sclera: Conjunctivae normal.  ?   Pupils: Pupils are equal, round, and reactive to light.  ?Neck:  ?   Thyroid: No thyroid mass or thyromegaly.  ?   Vascular: No carotid bruit.  ?   Trachea: Trachea normal.  ?Cardiovascular:  ?   Rate and Rhythm: Normal rate and regular rhythm.  ?   Heart sounds: Normal heart sounds, S1 normal and S2 normal. No murmur heard. ?  No gallop.  ?Pulmonary:  ?   Effort: Pulmonary effort is normal. No respiratory distress.  ?   Breath sounds: Normal breath sounds. No wheezing, rhonchi or rales.  ?Abdominal:  ?   General: Bowel sounds are normal. There is no distension or abdominal bruit.  ?   Palpations: Abdomen is soft. There is no fluid wave or mass.  ?   Tenderness: There is no abdominal tenderness. There is no guarding or rebound.  ?   Hernia: No hernia is present.  ?Musculoskeletal:  ?   Cervical back: Normal range of motion and neck supple.  ?Lymphadenopathy:  ?   Cervical: No cervical adenopathy.  ?Skin: ?   General: Skin is warm and dry.  ?   Findings: No rash.  ?Neurological:  ?   Mental Status: She is alert.  ?   Cranial Nerves: No cranial nerve deficit.  ?   Sensory: No sensory deficit.  ?Psychiatric:     ?   Mood and Affect: Mood is not anxious or depressed.     ?   Speech: Speech normal.     ?   Behavior: Behavior normal. Behavior is cooperative.     ?   Judgment: Judgment normal.  ? ?   ?Results for orders placed or performed in visit on 04/14/22  ?Lipid panel  ?Result Value Ref Range  ? Cholesterol 271 (H) 0 - 200 mg/dL  ? Triglycerides 146.0 0.0 - 149.0 mg/dL  ? HDL 58.10 >39.00 mg/dL  ? VLDL 29.2 0.0 - 40.0 mg/dL  ? LDL Cholesterol 183 (H) 0 - 99 mg/dL  ? Total CHOL/HDL Ratio 5   ? NonHDL 212.50   ?Hemoglobin A1c  ?Result Value Ref Range  ? Hgb A1c  MFr Bld 6.3 4.6 - 6.5 %  ?Comprehensive metabolic panel  ?Result Value Ref Range  ? Sodium 141 135 - 145 mEq/L  ? Potassium 4.1 3.5 - 5.1 mEq/L  ? Chloride 103 96 - 112 mEq/L  ? CO2 31 19 - 32 mEq/L  ? Glucose, Bld 92 70 - 99 mg/dL  ? BUN 16 6 - 23 mg/dL  ? Creatinine,  Ser 0.62 0.40 - 1.20 mg/dL  ? Total Bilirubin 0.7 0.2 - 1.2 mg/dL  ? Alkaline Phosphatase 71 39 - 117 U/L  ? AST 17 0 - 37 U/L  ? ALT 20 0 - 35 U/L  ? Total Protein 6.8 6.0 - 8.3 g/dL  ? Albumin 4.6 3.5 - 5.2 g/dL  ? GFR 91.55 >60.00 mL/min  ? Calcium 9.4 8.4 - 10.5 mg/dL  ?Vitamin B12  ?Result Value Ref Range  ? Vitamin B-12 180 (L) 211 - 911 pg/mL  ?CBC with Differential/Platelet  ?Result Value Ref Range  ? WBC 5.0 4.0 - 10.5 K/uL  ? RBC 4.59 3.87 - 5.11 Mil/uL  ? Hemoglobin 13.5 12.0 - 15.0 g/dL  ? HCT 40.6 36.0 - 46.0 %  ? MCV 88.4 78.0 - 100.0 fl  ? MCHC 33.2 30.0 - 36.0 g/dL  ? RDW 13.5 11.5 - 15.5 %  ? Platelets 289.0 150.0 - 400.0 K/uL  ? Neutrophils Relative % 54.9 43.0 - 77.0 %  ? Lymphocytes Relative 36.5 12.0 - 46.0 %  ? Monocytes Relative 6.4 3.0 - 12.0 %  ? Eosinophils Relative 1.7 0.0 - 5.0 %  ? Basophils Relative 0.5 0.0 - 3.0 %  ? Neutro Abs 2.8 1.4 - 7.7 K/uL  ? Lymphs Abs 1.8 0.7 - 4.0 K/uL  ? Monocytes Absolute 0.3 0.1 - 1.0 K/uL  ? Eosinophils Absolute 0.1 0.0 - 0.7 K/uL  ? Basophils Absolute 0.0 0.0 - 0.1 K/uL  ?TSH  ?Result Value Ref Range  ? TSH 1.61 0.35 - 5.50 uIU/mL  ?Hepatitis C Antibody  ?Result Value Ref Range  ? Hepatitis C Ab NON-REACTIVE NON-REACTIVE  ? SIGNAL TO CUT-OFF 0.14 <1.00  ? ? ? ?COVID 19 screen:  No recent travel or known exposure to COVID19 ?The patient denies respiratory symptoms of COVID 19 at this time. ?The importance of social distancing was discussed today.  ? ?Assessment and Plan ?The patient's preventative maintenance and recommended screening tests for an annual wellness exam were reviewed in full today. ?Brought up to date unless services declined. ? ?Counselled on the importance of diet,  exercise, and its role in overall health and mortality. ?The patient's FH and SH was reviewed, including their home life, tobacco status, and drug and alcohol status.  ? ?Vaccines: Consider COVID booster,  given rx for shi

## 2022-05-09 DIAGNOSIS — Z1211 Encounter for screening for malignant neoplasm of colon: Secondary | ICD-10-CM | POA: Diagnosis not present

## 2022-05-18 LAB — COLOGUARD: COLOGUARD: NEGATIVE

## 2022-05-24 NOTE — Assessment & Plan Note (Signed)
Chronic, stable control on losartan hydrochlorothiazide 50/12.5 mg p.o. daily

## 2022-05-24 NOTE — Assessment & Plan Note (Signed)
Reevaluation needed.

## 2022-08-14 ENCOUNTER — Telehealth: Payer: Self-pay

## 2022-08-14 NOTE — Telephone Encounter (Signed)
Unable to reach pt or pts husband by phone; per DPR left detailed v/m to contact [facility] [phone] or [phone].

## 2022-08-14 NOTE — Telephone Encounter (Signed)
[facility] Night - Client Nonclinical Telephone Record  [facility] Client [facility] Night - Client Client Site [facility] - Night Provider Kerby Nora - MD Contact Type Call Who Is Calling Patient / Member / Family / Caregiver Caller Name [name] Caller Phone Number [phone] Patient Name Amber Burns Patient DOB [birth year] Call Type Message Only Information Provided Reason for Call Billing Question Initial Comment Caller states he has a question about his wife's bill. Additional Comment Provided office hours Disp. Time Disposition Final User 08/12/2022 1:32:27 PM General Information Provided Yes Talmadge Coventry Call Closed By: Talmadge Coventry Transaction Date/Time: 08/12/2022 1:28:41 PM (ET

## 2022-09-05 ENCOUNTER — Telehealth: Payer: Self-pay | Admitting: Family Medicine

## 2022-09-05 NOTE — Telephone Encounter (Signed)
Patients husband called in with a billing issue. He has already contacted the billing department ,but he's saying that they are not helping him. He would like a phone call.

## 2022-09-06 NOTE — Telephone Encounter (Signed)
Sorry sent to the wrong amy!! Please disregard.

## 2022-10-23 ENCOUNTER — Other Ambulatory Visit: Payer: Medicare Other

## 2023-03-28 ENCOUNTER — Other Ambulatory Visit: Payer: Self-pay | Admitting: Family Medicine

## 2023-04-12 ENCOUNTER — Ambulatory Visit (INDEPENDENT_AMBULATORY_CARE_PROVIDER_SITE_OTHER): Payer: Medicare Other | Admitting: Family Medicine

## 2023-04-12 ENCOUNTER — Ambulatory Visit
Admission: RE | Admit: 2023-04-12 | Discharge: 2023-04-12 | Disposition: A | Discharge status: Home / Self Care | Payer: Medicare Other | Source: Ambulatory Visit | Attending: Family Medicine | Admitting: Family Medicine

## 2023-04-12 ENCOUNTER — Encounter: Payer: Self-pay | Admitting: Family Medicine

## 2023-04-12 VITALS — BP 110/70 | HR 68 | Temp 97.7°F | Ht 65.25 in | Wt 196.5 lb

## 2023-04-12 DIAGNOSIS — M25532 Pain in left wrist: Secondary | ICD-10-CM | POA: Insufficient documentation

## 2023-04-12 DIAGNOSIS — S52592A Other fractures of lower end of left radius, initial encounter for closed fracture: Secondary | ICD-10-CM | POA: Insufficient documentation

## 2023-04-12 DIAGNOSIS — M25531 Pain in right wrist: Secondary | ICD-10-CM | POA: Insufficient documentation

## 2023-04-12 DIAGNOSIS — W19XXXA Unspecified fall, initial encounter: Secondary | ICD-10-CM | POA: Insufficient documentation

## 2023-04-12 DIAGNOSIS — S52325A Nondisplaced transverse fracture of shaft of left radius, initial encounter for closed fracture: Secondary | ICD-10-CM | POA: Diagnosis not present

## 2023-04-12 NOTE — Assessment & Plan Note (Signed)
Acute.. concern for fracture. . Continue ice, elevation and restricted movement.  Will send for x-ray to evaluate for fracture

## 2023-04-12 NOTE — Addendum Note (Signed)
Addended by: Kerby Nora E on: 04/12/2023 03:14 PM   Modules accepted: Orders

## 2023-04-12 NOTE — Patient Instructions (Signed)
Continue to ice, elevated and can use ibuprofen as needed for pain.  We will call with X-ray results today.

## 2023-04-12 NOTE — Addendum Note (Signed)
Addended by: Kerby Nora E on: 04/12/2023 02:23 PM   Modules accepted: Orders

## 2023-04-12 NOTE — Progress Notes (Addendum)
Patient ID: Amber Burns, female    DOB: June 01, 1951, 72 y.o.   MRN: [id number]  This visit was conducted in person.  BP 110/70   Pulse 68   Temp 97.7 F (36.5 C) (Temporal)   Ht 5' 5.25" (1.657 m)   Wt 196 lb 8 oz (89.1 kg)   SpO2 94%   BMI 32.45 kg/m    CC:  Chief Complaint  Patient presents with   Fall    04/08/23   Wrist Pain    Left    Subjective:   HPI: Amber Burns is a 72 y.o. female presenting on 04/12/2023 for Fall (04/08/23) and Wrist Pain (Left)   Pt reports falling on 04/08/23, tipped on  hole, no proceeding symptoms.  Landed on outstretched left hand.... after  hand bruising at  wrist and swelling.  Pain greatest in radial side of wrist.. some extension into shoulder.  Tingling in wrist.   She has been icing and  elevating.         Relevant past medical, surgical, family and social history reviewed and updated as indicated. Interim medical history since our last visit reviewed. Allergies and medications reviewed and updated. Outpatient Medications Prior to Visit  Medication Sig Dispense Refill   losartan-hydrochlorothiazide (HYZAAR) 50-12.5 MG tablet TAKE 1 TABLET BY MOUTH DAILY 90 tablet 0   No facility-administered medications prior to visit.     Per HPI unless specifically indicated in ROS section below Review of Systems  Constitutional:  Negative for fatigue and fever.  HENT:  Negative for ear pain.   Eyes:  Negative for pain.  Respiratory:  Negative for chest tightness and shortness of breath.   Cardiovascular:  Negative for chest pain, palpitations and leg swelling.  Gastrointestinal:  Negative for abdominal pain.  Genitourinary:  Negative for dysuria.   Objective:  BP 110/70   Pulse 68   Temp 97.7 F (36.5 C) (Temporal)   Ht 5' 5.25" (1.657 m)   Wt 196 lb 8 oz (89.1 kg)   SpO2 94%   BMI 32.45 kg/m   Wt Readings from Last 3 Encounters:  04/12/23 196 lb 8 oz (89.1 kg)  04/27/22 197 lb 11.2 oz (89.7 kg)  04/14/22  195 lb 7 oz (88.6 kg)      Physical Exam Constitutional:      General: She is not in acute distress.    Appearance: Normal appearance. She is well-developed. She is not ill-appearing or toxic-appearing.  HENT:     Head: Normocephalic.     Right Ear: Hearing, tympanic membrane, ear canal and external ear normal. Tympanic membrane is not erythematous, retracted or bulging.     Left Ear: Hearing, tympanic membrane, ear canal and external ear normal. Tympanic membrane is not erythematous, retracted or bulging.     Nose: No mucosal edema or rhinorrhea.     Right Sinus: No maxillary sinus tenderness or frontal sinus tenderness.     Left Sinus: No maxillary sinus tenderness or frontal sinus tenderness.     Mouth/Throat:     Pharynx: Uvula midline.  Eyes:     General: Lids are normal. Lids are everted, no foreign bodies appreciated.     Conjunctiva/sclera: Conjunctivae normal.     Pupils: Pupils are equal, round, and reactive to light.  Neck:     Thyroid: No thyroid mass or thyromegaly.     Vascular: No carotid bruit.     Trachea: Trachea normal.  Cardiovascular:     Rate and Rhythm:  Normal rate and regular rhythm.     Pulses: Normal pulses.     Heart sounds: Normal heart sounds, S1 normal and S2 normal. No murmur heard.    No friction rub. No gallop.  Pulmonary:     Effort: Pulmonary effort is normal. No tachypnea or respiratory distress.     Breath sounds: Normal breath sounds. No decreased breath sounds, wheezing, rhonchi or rales.  Abdominal:     General: Bowel sounds are normal.     Palpations: Abdomen is soft.     Tenderness: There is no abdominal tenderness.  Musculoskeletal:     Right shoulder: Normal.     Left shoulder: Normal.     Right upper arm: Normal.     Left upper arm: Normal.     Right elbow: Normal range of motion. No tenderness.     Left elbow: Decreased range of motion. No tenderness.     Right forearm: No swelling, tenderness or bony tenderness.     Left  forearm: Swelling, tenderness and bony tenderness present.     Right wrist: No swelling or tenderness. Normal range of motion.     Left wrist: Swelling and tenderness present. Decreased range of motion.     Cervical back: Normal range of motion and neck supple.  Skin:    General: Skin is warm and dry.     Findings: No rash.  Neurological:     Mental Status: She is alert.  Psychiatric:        Mood and Affect: Mood is not anxious or depressed.        Speech: Speech normal.        Behavior: Behavior normal. Behavior is cooperative.        Thought Content: Thought content normal.        Judgment: Judgment normal.       Results for orders placed or performed in visit on 04/27/22  Cologuard  Result Value Ref Range   COLOGUARD Negative Negative    Assessment and Plan  Left wrist pain Assessment & Plan:  Acute.. concern for fracture. . Continue ice, elevation and restricted movement.  Will send for x-ray to evaluate for fracture  Orders: -     DG Forearm Left; Future -     DG Wrist Complete Left; Future  Accidental fall, initial encounter -     DG Forearm Left; Future    No follow-ups on file.   Kerby Nora, MD

## 2023-04-17 ENCOUNTER — Telehealth: Payer: Self-pay | Admitting: Family Medicine

## 2023-04-17 ENCOUNTER — Other Ambulatory Visit: Payer: Self-pay | Admitting: Family Medicine

## 2023-04-17 DIAGNOSIS — S52529D Torus fracture of lower end of unspecified radius, subsequent encounter for fracture with routine healing: Secondary | ICD-10-CM

## 2023-04-17 MED ORDER — TRAMADOL HCL 50 MG PO TABS: 50.0000 mg | ORAL_TABLET | Freq: Three times a day (TID) | ORAL | 0 refills | Status: AC | PRN

## 2023-04-17 NOTE — Telephone Encounter (Signed)
Patient called in to follow up on her xray results. She stated that she hasn't heard anything about it yet. Thank you!

## 2023-04-17 NOTE — Telephone Encounter (Signed)
This patient had an x-ray on April 12, 2023 but given we did not have a x-ray tech on 4/25 she had to go to [facility] location.  Unfortunately the x-ray result was delayed and coming back and only came back today. She was uncomfortable over the weekend but did not let me know Friday or Monday with a call.  I did not notice that the x-ray had not returned and so did not contact her until I saw it come up on epic today. Obviously she was very upset about the delay.  I spoke with her on the phone and we have moved forward with pain treatment and referral to orthopedics. I am mainly sending you this note, not necessarily that you have to contact her,  but so that we could figure out what the problem was and why there was such a long delay. For sure the next time I order an outside x-ray, I will make myself a note to follow it more closely and call if the results are delayed.

## 2023-04-18 DIAGNOSIS — S52512A Displaced fracture of left radial styloid process, initial encounter for closed fracture: Secondary | ICD-10-CM | POA: Diagnosis not present

## 2023-04-30 DIAGNOSIS — S52502D Unspecified fracture of the lower end of left radius, subsequent encounter for closed fracture with routine healing: Secondary | ICD-10-CM | POA: Diagnosis not present

## 2023-05-01 ENCOUNTER — Encounter: Payer: Medicare Other | Admitting: Family Medicine

## 2023-05-03 ENCOUNTER — Ambulatory Visit (INDEPENDENT_AMBULATORY_CARE_PROVIDER_SITE_OTHER): Payer: Medicare Other | Admitting: Family Medicine

## 2023-05-03 ENCOUNTER — Encounter: Payer: Self-pay | Admitting: Family Medicine

## 2023-05-03 VITALS — BP 158/80 | HR 77 | Temp 97.5°F | Ht 64.75 in | Wt 199.2 lb

## 2023-05-03 DIAGNOSIS — Z0000 Encounter for general adult medical examination without abnormal findings: Secondary | ICD-10-CM

## 2023-05-03 DIAGNOSIS — R7303 Prediabetes: Secondary | ICD-10-CM | POA: Diagnosis not present

## 2023-05-03 DIAGNOSIS — I10 Essential (primary) hypertension: Secondary | ICD-10-CM | POA: Diagnosis not present

## 2023-05-03 DIAGNOSIS — E7800 Pure hypercholesterolemia, unspecified: Secondary | ICD-10-CM

## 2023-05-03 DIAGNOSIS — E538 Deficiency of other specified B group vitamins: Secondary | ICD-10-CM | POA: Diagnosis not present

## 2023-05-03 LAB — COMPREHENSIVE METABOLIC PANEL
ALT: 27 U/L (ref 0–35)
AST: 18 U/L (ref 0–37)
Albumin: 4.2 g/dL (ref 3.5–5.2)
Alkaline Phosphatase: 85 U/L (ref 39–117)
BUN: 19 mg/dL (ref 6–23)
CO2: 32 mEq/L (ref 19–32)
Calcium: 9.9 mg/dL (ref 8.4–10.5)
Chloride: 100 mEq/L (ref 96–112)
Creatinine, Ser: 0.79 mg/dL (ref 0.40–1.20)
GFR: 76.34 mL/min (ref >60.00)
Glucose, Bld: 93 mg/dL (ref 70–99)
Potassium: 3.9 mEq/L (ref 3.5–5.1)
Sodium: 140 mEq/L (ref 135–145)
Total Bilirubin: 0.5 mg/dL (ref 0.2–1.2)
Total Protein: 7 g/dL (ref 6.0–8.3)

## 2023-05-03 LAB — LIPID PANEL
Cholesterol: 244 mg/dL — ABNORMAL HIGH (ref 0–200)
HDL: 51.3 mg/dL (ref >39.00)
NonHDL: 192.68
Total CHOL/HDL Ratio: 5
Triglycerides: 233 mg/dL — ABNORMAL HIGH (ref 0.0–149.0)
VLDL: 46.6 mg/dL — ABNORMAL HIGH (ref 0.0–40.0)

## 2023-05-03 LAB — VITAMIN B12: Vitamin B-12: 737 pg/mL (ref 211–911)

## 2023-05-03 LAB — LDL CHOLESTEROL, DIRECT: Direct LDL: 168 mg/dL

## 2023-05-03 LAB — HEMOGLOBIN A1C: Hgb A1c MFr Bld: 6.6 % — ABNORMAL HIGH (ref 4.6–6.5)

## 2023-05-03 MED ORDER — LOSARTAN POTASSIUM-HCTZ 50-12.5 MG PO TABS
1.0000 | ORAL_TABLET | Freq: Every day | ORAL | 3 refills | Status: DC
Start: 2023-05-03 — End: 2023-07-16

## 2023-05-03 NOTE — Assessment & Plan Note (Signed)
Due for re-eval. 

## 2023-05-03 NOTE — Patient Instructions (Addendum)
Check BP every few days for the new few weeks... goal < 150/90.Marland Kitchen bring in measurements.  Please stop at the lab to have labs drawn.

## 2023-05-03 NOTE — Progress Notes (Signed)
Patient ID: Amber Burns, female    DOB: Jun 05, 1951, 72 y.o.   MRN: [id number]  This visit was conducted in person.  BP (!) 158/80   Pulse 77   Temp (!) 97.5 F (36.4 C) (Temporal)   Ht 5' 4.75" (1.645 m)   Wt 199 lb 4 oz (90.4 kg)   SpO2 95%   BMI 33.41 kg/m    CC:  Chief Complaint  Patient presents with   Medicare Wellness    Subjective:   HPI: Amber Burns is a 72 y.o. female presenting on 05/03/2023 for Medicare Wellness  The patient presents for annual medicare wellness, complete physical and review of chronic health problems. He/She also has the following acute concerns today:  I have personally reviewed the Medicare Annual Wellness questionnaire and have noted 1. The patient's medical and social history 2. Their use of alcohol, tobacco or illicit drugs 3. Their current medications and supplements 4. The patient's functional ability including ADL's, fall risks, home safety risks and hearing or visual             impairment. 5. Diet and physical activities 6. Evidence for depression or mood disorders 7.         Updated provider list Cognitive evaluation was performed and recorded on pt medicare questionnaire form. The patients weight, height, BMI and visual acuity have been recorded in the chart   I have made referrals, counseling and provided education to the patient based review of the above and I have provided the pt with a written personalized care plan for preventive services.   Documentation of this information was scanned into the electronic record under the media tab.  No falls in last 12 months.  Prediabetes  Due for re-eval  HTN,  Stable control on losartan HCTZ BP Readings from Last 3 Encounters:  05/03/23 (!) 158/80  04/12/23 110/70  04/27/22 124/78  Chest pain with exertion: none Edema: none Short of breath: with exertion going up stairs... ongoing for years... not worsening Average home BPs: Other issues:   Elevated  Cholesterol:  Due for re-eval. Using medications without problems: Muscle aches:  Diet compliance: good Exercise: house work Other complaints:    Taking B 12 vitamin.  Relevant past medical, surgical, family and social history reviewed and updated as indicated. Interim medical history since our last visit reviewed. Allergies and medications reviewed and updated. Outpatient Medications Prior to Visit  Medication Sig Dispense Refill   losartan-hydrochlorothiazide (HYZAAR) 50-12.5 MG tablet TAKE 1 TABLET BY MOUTH DAILY 90 tablet 0   No facility-administered medications prior to visit.     Per HPI unless specifically indicated in ROS section below Review of Systems  Constitutional:  Negative for fatigue and fever.  HENT:  Negative for congestion.   Eyes:  Negative for pain.  Respiratory:  Negative for cough and shortness of breath.   Cardiovascular:  Negative for chest pain, palpitations and leg swelling.  Gastrointestinal:  Negative for abdominal pain.  Genitourinary:  Negative for dysuria and vaginal bleeding.  Musculoskeletal:  Negative for back pain.  Neurological:  Negative for syncope, light-headedness and headaches.  Psychiatric/Behavioral:  Negative for dysphoric mood.    Objective:  BP (!) 158/80   Pulse 77   Temp (!) 97.5 F (36.4 C) (Temporal)   Ht 5' 4.75" (1.645 m)   Wt 199 lb 4 oz (90.4 kg)   SpO2 95%   BMI 33.41 kg/m   Wt Readings from Last 3 Encounters:  05/03/23 199 lb  4 oz (90.4 kg)  04/12/23 196 lb 8 oz (89.1 kg)  04/27/22 197 lb 11.2 oz (89.7 kg)      Physical Exam Vitals and nursing note reviewed.  Constitutional:      General: She is not in acute distress.    Appearance: Normal appearance. She is well-developed. She is not ill-appearing or toxic-appearing.  HENT:     Head: Normocephalic.     Right Ear: Hearing, tympanic membrane, ear canal and external ear normal.     Left Ear: Hearing, tympanic membrane, ear canal and external ear normal.      Nose: Nose normal.  Eyes:     General: Lids are normal. Lids are everted, no foreign bodies appreciated.     Conjunctiva/sclera: Conjunctivae normal.     Pupils: Pupils are equal, round, and reactive to light.  Neck:     Thyroid: No thyroid mass or thyromegaly.     Vascular: No carotid bruit.     Trachea: Trachea normal.  Cardiovascular:     Rate and Rhythm: Normal rate and regular rhythm.     Heart sounds: Normal heart sounds, S1 normal and S2 normal. No murmur heard.    No gallop.  Pulmonary:     Effort: Pulmonary effort is normal. No respiratory distress.     Breath sounds: Normal breath sounds. No wheezing, rhonchi or rales.  Abdominal:     General: Bowel sounds are normal. There is no distension or abdominal bruit.     Palpations: Abdomen is soft. There is no fluid wave or mass.     Tenderness: There is no abdominal tenderness. There is no guarding or rebound.     Hernia: No hernia is present.  Musculoskeletal:     Cervical back: Normal range of motion and neck supple.  Lymphadenopathy:     Cervical: No cervical adenopathy.  Skin:    General: Skin is warm and dry.     Findings: No rash.  Neurological:     Mental Status: She is alert.     Cranial Nerves: No cranial nerve deficit.     Sensory: No sensory deficit.  Psychiatric:        Mood and Affect: Mood is not anxious or depressed.        Speech: Speech normal.        Behavior: Behavior normal. Behavior is cooperative.        Judgment: Judgment normal.       Results for orders placed or performed in visit on 04/27/22  Cologuard  Result Value Ref Range   COLOGUARD Negative Negative     COVID 19 screen:  No recent travel or known exposure to COVID19 The patient denies respiratory symptoms of COVID 19 at this time. The importance of social distancing was discussed today.   Assessment and Plan The patient's preventative maintenance and recommended screening tests for an annual wellness exam were reviewed in full  today. Brought up to date unless services declined.  Counselled on the importance of diet, exercise, and its role in overall health and mortality. The patient's FH and SH was reviewed, including their home life, tobacco status, and drug and alcohol status.   Vaccines: Consider COVID booster,  encouraged shingrix,  Uptodate prevnar 20 and consider tdap Pap/DVE:  not indicated Mammo:  reviewed.. she refuses given pain. No family history breast cancer, she wpuld like to do home breast exam. Bone Density:  she is not interested in this now , consider next. Year. Recommend weight  bearing exercise, calcium in diet and vit D supplement 400 IU 1-2 times daily.  Colon: Cologuard negative in 2023 Smoking Status: none ETOH/ drug use: none/none  Hep C:  negative  HIV screen:   refused Problem List Items Addressed This Visit     B12 deficiency    Due for re-eval.      Relevant Orders   Vitamin B12   HTN (hypertension), benign    Chronic, previously stable control on losartan hydrochlorothiazide 50/12.5 mg p.o. daily. Will have patient follow at home, she will call in 1 to 2 weeks with blood pressure measurements to consider increasing dose of losartan hydrochlorothiazide to 100/25 mg daily.  She does note some headache a pressure in the back of her head later in the day.       Relevant Medications   losartan-hydrochlorothiazide (HYZAAR) 50-12.5 MG tablet   Hypercholesteremia    Due for re-eval      Relevant Medications   losartan-hydrochlorothiazide (HYZAAR) 50-12.5 MG tablet   Other Relevant Orders   Lipid panel   Comprehensive metabolic panel   Prediabetes    Due for re-eval.      Relevant Orders   Hemoglobin A1c   Other Visit Diagnoses     Medicare annual wellness visit, subsequent    -  Primary       Kerby Nora, MD

## 2023-05-03 NOTE — Assessment & Plan Note (Addendum)
Chronic, previously stable control on losartan hydrochlorothiazide 50/12.5 mg p.o. daily. Will have patient follow at home, she will call in 1 to 2 weeks with blood pressure measurements to consider increasing dose of losartan hydrochlorothiazide to 100/25 mg daily.  She does note some headache a pressure in the back of her head later in the day.

## 2023-05-04 MED ORDER — ROSUVASTATIN CALCIUM 5 MG PO TABS
5.0000 mg | ORAL_TABLET | Freq: Every day | ORAL | 3 refills | Status: DC
Start: 2023-05-04 — End: 2024-05-28

## 2023-05-04 NOTE — Addendum Note (Signed)
Addended by: Kerby Nora E on: 05/04/2023 02:18 PM   Modules accepted: Orders

## 2023-05-15 DIAGNOSIS — S52502D Unspecified fracture of the lower end of left radius, subsequent encounter for closed fracture with routine healing: Secondary | ICD-10-CM | POA: Diagnosis not present

## 2023-07-16 ENCOUNTER — Telehealth: Payer: Self-pay | Admitting: Family Medicine

## 2023-07-16 MED ORDER — AMLODIPINE BESYLATE 5 MG PO TABS: 5.0000 mg | ORAL_TABLET | Freq: Every day | ORAL | 11 refills | Status: DC

## 2023-07-16 NOTE — Addendum Note (Signed)
Addended by: Damita Lack on: 07/16/2023 04:54 PM   Modules accepted: Orders

## 2023-07-16 NOTE — Telephone Encounter (Signed)
Pt's husband, Onalee Hua, called to let Dr. Ermalene Searing the pt has been experiencing some hair loss & thinning since the start of the meds, losartan-hydrochlorothiazide (HYZAAR) 50-12.5 MG tablet. Onalee Hua states he is also experiencing the same symptoms from the same meds but his condition is worse than the pt's. Onalee Hua is requesting advice & also requesting a change in the meds. Pt's preferred pharmacy is [id number] - [place], [place] - [address] Call back # [phone]

## 2023-07-16 NOTE — Telephone Encounter (Signed)
Amber Burns notified as instructed by telephone.  He states [name] has never been on any other BP medications.  He denies any history of peripheral swelling.  Patient will check blood pressures daily and call or MyChart with those reading next week.  [facility] to [facility] instructed by Dr. Ermalene Searing.  Medication list updated.

## 2023-07-16 NOTE — Telephone Encounter (Signed)
See her husbands chart with comments.

## 2023-07-16 NOTE — Addendum Note (Signed)
Addended by: Damita Lack on: 07/16/2023 04:59 PM   Modules accepted: Orders

## 2023-08-02 ENCOUNTER — Encounter (INDEPENDENT_AMBULATORY_CARE_PROVIDER_SITE_OTHER): Payer: Self-pay

## 2023-09-03 ENCOUNTER — Other Ambulatory Visit: Payer: Medicare Other

## 2023-09-04 ENCOUNTER — Ambulatory Visit: Payer: Medicare Other | Admitting: Family Medicine

## 2024-05-27 ENCOUNTER — Other Ambulatory Visit: Payer: Self-pay | Admitting: Family Medicine

## 2024-05-28 ENCOUNTER — Other Ambulatory Visit: Payer: Self-pay | Admitting: Family Medicine

## 2024-05-28 DIAGNOSIS — I10 Essential (primary) hypertension: Secondary | ICD-10-CM

## 2024-05-28 NOTE — Telephone Encounter (Signed)
 lvm for pt to call office to schedule appt.

## 2024-05-28 NOTE — Telephone Encounter (Signed)
 Please schedule Medicare Wellness with Cornelius Dill and CPE with fasting labs priori for Dr. Cherlyn Cornet.

## 2024-05-29 NOTE — Telephone Encounter (Signed)
 Pt stated that she  is in a another stated and will call office when she come back

## 2024-08-08 ENCOUNTER — Ambulatory Visit (INDEPENDENT_AMBULATORY_CARE_PROVIDER_SITE_OTHER)

## 2024-08-08 VITALS — Ht 67.0 in | Wt 199.0 lb

## 2024-08-08 DIAGNOSIS — Z0000 Encounter for general adult medical examination without abnormal findings: Secondary | ICD-10-CM | POA: Diagnosis not present

## 2024-08-08 NOTE — Progress Notes (Signed)
 Subjective:   Amber Burns is a 73 y.o. who presents for a Medicare Wellness preventive visit.  As a reminder, Annual Wellness Visits don't include a physical exam, and some assessments may be limited, especially if this visit is performed virtually. We may recommend an in-person follow-up visit with your provider if needed.  Visit Complete: Virtual I connected with  Amber Burns on 08/08/24 by a audio enabled telemedicine application and verified that I am speaking with the correct person using two identifiers.  Patient Location: Home  Provider Location: Office/Clinic  I discussed the limitations of evaluation and management by telemedicine. The patient expressed understanding and agreed to proceed.  Vital Signs: Because this visit was a virtual/telehealth visit, some criteria may be missing or patient reported. Any vitals not documented were not able to be obtained and vitals that have been documented are patient reported.  VideoDeclined- This patient declined Librarian, academic. Therefore the visit was completed with audio only.  Persons Participating in Visit: Patient.  AWV Questionnaire: Yes: Patient Medicare AWV questionnaire was completed by the patient on 08/07/24; I have confirmed that all information answered by patient is correct and no changes since this date.  Cardiac Risk Factors include: advanced age (>71men, >67 women);hypertension;obesity (BMI >30kg/m2)     Objective:    Today's Vitals   08/08/24 1418  Weight: 199 lb (90.3 kg)  Height: 5' 7 (1.702 m)   Body mass index is 31.17 kg/m.     08/08/2024    2:23 PM 04/27/2022    2:58 PM  Advanced Directives  Does Patient Have a Medical Advance Directive? No No  Would patient like information on creating a medical advance directive?  No - Patient declined    Current Medications (verified) Outpatient Encounter Medications as of 08/08/2024  Medication Sig   amLODipine   (NORVASC ) 5 MG tablet TAKE 1 TABLET (5 MG TOTAL) BY MOUTH DAILY.   rosuvastatin  (CRESTOR ) 5 MG tablet TAKE 1 TABLET (5 MG TOTAL) BY MOUTH DAILY.   No facility-administered encounter medications on file as of 08/08/2024.    Allergies (verified) Aspirin, Ibuprofen, and Penicillins   History: Past Medical History:  Diagnosis Date   Allergy 2010   pcn, aspirin, motrin   Arthritis    GERD (gastroesophageal reflux disease)    Hyperlipidemia 2020   Hypertension 2020   Past Surgical History:  Procedure Laterality Date   APPENDECTOMY  1974   TONSILLECTOMY  1977   Family History  Problem Relation Age of Onset   COPD Father    Social History   Socioeconomic History   Marital status: Married    Spouse name: Not on file   Number of children: Not on file   Years of education: Not on file   Highest education level: Master's degree (e.g., MA, MS, MEng, MEd, MSW, MBA)  Occupational History   Occupation: retired Engineer, civil (consulting), Therapist, sports  Tobacco Use   Smoking status: Never   Smokeless tobacco: Not on file  Vaping Use   Vaping status: Never Used  Substance and Sexual Activity   Alcohol use: No   Drug use: Never   Sexual activity: Not Currently    Birth control/protection: Post-menopausal  Other Topics Concern   Not on file  Social History Narrative   Not on file   Social Drivers of Health   Financial Resource Strain: Low Risk  (08/07/2024)   Overall Financial Resource Strain (CARDIA)    Difficulty of Paying Living Expenses: Not very hard  Food  Insecurity: No Food Insecurity (08/07/2024)   Hunger Vital Sign    Worried About Running Out of Food in the Last Year: Never true    Ran Out of Food in the Last Year: Never true  Transportation Needs: No Transportation Needs (08/07/2024)   PRAPARE - Administrator, Civil Service (Medical): No    Lack of Transportation (Non-Medical): No  Physical Activity: Sufficiently Active (08/07/2024)   Exercise Vital Sign    Days of  Exercise per Week: 7 days    Minutes of Exercise per Session: 40 min  Stress: No Stress Concern Present (08/07/2024)   [employer] of Occupational Health - Occupational Stress Questionnaire    Feeling of Stress: Only a little  Social Connections: Moderately Isolated (08/07/2024)   Social Connection and Isolation Panel    Frequency of Communication with Friends and Family: More than three times a week    Frequency of Social Gatherings with Friends and Family: More than three times a week    Attends Religious Services: Never    Database administrator or Organizations: No    Attends Engineer, structural: Not on file    Marital Status: Married    Tobacco Counseling Counseling given: Not Answered    Clinical Intake:  Pre-visit preparation completed: Yes  Pain : No/denies pain     BMI - recorded: 31.17 Nutritional Status: BMI > 30  Obese Nutritional Risks: None Diabetes: No  Lab Results  Component Value Date   HGBA1C 6.6 (H) 05/03/2023   HGBA1C 6.3 04/14/2022   HGBA1C 6.1 (H) 10/01/2020     How often do you need to have someone help you when you read instructions, pamphlets, or other written materials from your doctor or pharmacy?: 1 - Never  Interpreter Needed?: No  Comments: lives with spouse Information entered by :: B.Tandy Grawe,LPN   Activities of Daily Living     08/07/2024    8:41 PM  In your present state of health, do you have any difficulty performing the following activities:  Hearing? 0  Vision? 0  Difficulty concentrating or making decisions? 0  Walking or climbing stairs? 0  Dressing or bathing? 0  Doing errands, shopping? 0  Preparing Food and eating ? N  Using the Toilet? N  In the past six months, have you accidently leaked urine? Y  Do you have problems with loss of bowel control? N  Managing your Medications? N  Managing your Finances? N  Housekeeping or managing your Housekeeping? N    Patient Care Team: Avelina Greig BRAVO, MD  as PCP - General (Family Medicine)  I have updated your Care Teams any recent Medical Services you may have received from other providers in the past year.     Assessment:   This is a routine wellness examination for [facility].  Hearing/Vision screen Hearing Screening - Comments:: Patient denies any hearing difficulties.   Vision Screening - Comments:: Pt says their vision is good with glasses No eye dr visit in awhile   Goals Addressed             This Visit's Progress    Patient Stated       I am working on healthy food, exercises and lose some weight       Depression Screen     08/08/2024    2:21 PM 05/03/2023    8:47 AM 04/14/2022   11:46 AM 10/01/2020    2:15 PM  PHQ 2/9 Scores  PHQ -  2 Score 0 0 0 0    Fall Risk     08/07/2024    8:41 PM 05/03/2023    8:47 AM 04/12/2023    2:04 PM 04/27/2022    2:52 PM 04/14/2022   11:46 AM  Fall Risk   Falls in the past year? 0 1 1 0 0  Number falls in past yr: 0 0 0    Injury with Fall? 0 1 1    Comment  L wrist     Risk for fall due to : No Fall Risks  History of fall(s)    Follow up Education provided;Falls prevention discussed  Falls evaluation completed Falls evaluation completed       Data saved with a previous flowsheet row definition    MEDICARE RISK AT HOME:  Medicare Risk at Home Any stairs in or around the home?: (Patient-Rptd) Yes If so, are there any without handrails?: (Patient-Rptd) Yes Home free of loose throw rugs in walkways, pet beds, electrical cords, etc?: (Patient-Rptd) Yes Adequate lighting in your home to reduce risk of falls?: (Patient-Rptd) Yes Life alert?: (Patient-Rptd) No Use of a cane, walker or w/c?: (Patient-Rptd) No Grab bars in the bathroom?: (Patient-Rptd) No Shower chair or bench in shower?: (Patient-Rptd) No Elevated toilet seat or a handicapped toilet?: (Patient-Rptd) No  TIMED UP AND GO:  Was the test performed?  No  Cognitive Function: 6CIT completed        08/08/2024     2:25 PM 04/27/2022    2:59 PM  6CIT Screen  What Year? 0 points 0 points  What month? 0 points 0 points  What time? 0 points 0 points  Count back from 20 0 points 0 points  Months in reverse 0 points 0 points  Repeat phrase 0 points 10 points  Total Score 0 points 10 points    Immunizations Immunization History  Administered Date(s) Administered   Janssen (J&J) SARS-COV-2 Vaccination 01/25/2021   PNEUMOCOCCAL CONJUGATE-20 04/27/2022    Screening Tests Health Maintenance  Topic Date Due   DTaP/Tdap/Td (1 - Tdap) Never done   MAMMOGRAM  Never done   Zoster Vaccines- Shingrix (1 of 2) Never done   DEXA SCAN  Never done   COVID-19 Vaccine (2 - 2024-25 season) 08/19/2023   INFLUENZA VACCINE  07/18/2024   Fecal DNA (Cologuard)  05/09/2025   Medicare Annual Wellness (AWV)  08/08/2025   Pneumococcal Vaccine: 50+ Years  Completed   Hepatitis C Screening  Completed   HPV VACCINES  Aged Out   Meningococcal B Vaccine  Aged Out    Health Maintenance  Health Maintenance Due  Topic Date Due   DTaP/Tdap/Td (1 - Tdap) Never done   MAMMOGRAM  Never done   Zoster Vaccines- Shingrix (1 of 2) Never done   DEXA SCAN  Never done   COVID-19 Vaccine (2 - 2024-25 season) 08/19/2023   INFLUENZA VACCINE  07/18/2024   Health Maintenance Items Addressed: Vaccinations: PT declines:: Influenza vaccine: recommend every Fall Tdap vaccine: recommend every 10 years Shingles vaccine: recommend Shingrix which is 2 doses 2-6 months apart and over 90% effective     Covid-19: recommend 2 doses one month apart with a booster 6 months later  Patient declined referral to establish with an ophthalmologist.   MMG abd Dexa Scan  Additional Screening:  Vision Screening: Recommended annual ophthalmology exams for early detection of glaucoma and other disorders of the eye. Would you like a referral to an eye doctor? No  Dental Screening: Recommended annual dental exams for proper oral  hygiene  Community Resource Referral / Chronic Care Management: CRR required this visit?  No   CCM required this visit?  No   Plan:    I have personally reviewed and noted the following in the patient's chart:   Medical and social history Use of alcohol, tobacco or illicit drugs  Current medications and supplements including opioid prescriptions. Patient is not currently taking opioid prescriptions. Functional ability and status Nutritional status Physical activity Advanced directives List of other physicians Hospitalizations, surgeries, and ER visits in previous 12 months Vitals Screenings to include cognitive, depression, and falls Referrals and appointments  In addition, I have reviewed and discussed with patient certain preventive protocols, quality metrics, and best practice recommendations. A written personalized care plan for preventive services as well as general preventive health recommendations were provided to patient.   Erminio LITTIE Saris, LPN   [id number]   After Visit Summary: (MyChart) Due to this being a telephonic visit, the after visit summary with patients personalized plan was offered to patient via MyChart   Notes: Nothing significant to report at this time.

## 2024-08-08 NOTE — Patient Instructions (Signed)
 Amber Burns , Thank you for taking time out of your busy schedule to complete your Annual Wellness Visit with me. I enjoyed our conversation and look forward to speaking with you again next year. I, as well as your care team,  appreciate your ongoing commitment to your health goals. Please review the following plan we discussed and let me know if I can assist you in the future. Your Game plan/ To Do List    Referrals: If you haven't heard from the office you've been referred to, please reach out to them at the phone provided.   Follow up Visits: We will see or speak with you next year for your Next Medicare AWV with our clinical staff-08/12/25@ 2:20pm Have you seen your provider in the last 6 months (3 months if uncontrolled diabetes)? No  Clinician Recommendations:  Aim for 30 minutes of exercise or brisk walking, 6-8 glasses of water, and 5 servings of fruits and vegetables each day.       This is a list of the screenings recommended for you:  Health Maintenance  Topic Date Due   DTaP/Tdap/Td vaccine (1 - Tdap) Never done   Mammogram  Never done   Zoster (Shingles) Vaccine (1 of 2) Never done   DEXA scan (bone density measurement)  Never done   COVID-19 Vaccine (2 - 2024-25 season) 08/19/2023   Flu Shot  07/18/2024   Cologuard (Stool DNA test)  05/09/2025   Medicare Annual Wellness Visit  08/08/2025   Pneumococcal Vaccine for age over 54  Completed   Hepatitis C Screening  Completed   HPV Vaccine  Aged Out   Meningitis B Vaccine  Aged Out    Advanced directives: (Declined) Advance directive discussed with you today. Even though you declined this today, please call our office should you change your mind, and we can give you the proper paperwork for you to fill out. Advance Care Planning is important because it:  [x]  Makes sure you receive the medical care that is consistent with your values, goals, and preferences  [x]  It provides guidance to your family and loved ones and  reduces their decisional burden about whether or not they are making the right decisions based on your wishes.  Follow the link provided in your after visit summary or read over the paperwork we have mailed to you to help you started getting your Advance Directives in place. If you need assistance in completing these, please reach out to us  so that we can help you!

## 2024-08-22 ENCOUNTER — Other Ambulatory Visit: Payer: Self-pay | Admitting: Family Medicine

## 2024-08-22 NOTE — Telephone Encounter (Signed)
 I called and left a voicemail for patient to be scheduled.

## 2024-08-22 NOTE — Telephone Encounter (Signed)
Please schedule CPE with fasting labs prior with Dr. Bedsole.  

## 2024-08-29 NOTE — Telephone Encounter (Signed)
 I spoked with patient and she stated she is currently out of town in [place] , pt mentioned when she comes back to [place] she would be able to come in for wellness visit and scheduled her cpe and fasting labs.

## 2024-09-13 ENCOUNTER — Other Ambulatory Visit: Payer: Self-pay | Admitting: Family Medicine

## 2024-09-21 ENCOUNTER — Other Ambulatory Visit: Payer: Self-pay | Admitting: Family Medicine

## 2024-12-10 ENCOUNTER — Other Ambulatory Visit: Payer: Self-pay | Admitting: Family Medicine

## 2024-12-10 ENCOUNTER — Telehealth: Payer: Self-pay | Admitting: *Deleted

## 2024-12-10 DIAGNOSIS — R7303 Prediabetes: Secondary | ICD-10-CM

## 2024-12-10 DIAGNOSIS — E7800 Pure hypercholesterolemia, unspecified: Secondary | ICD-10-CM

## 2024-12-10 DIAGNOSIS — E538 Deficiency of other specified B group vitamins: Secondary | ICD-10-CM

## 2024-12-10 NOTE — Telephone Encounter (Signed)
 Please call and schedule CPE with fasting labs prior with Dr. Ermalene Searing.  Send back to me once scheduled to refill medication.

## 2024-12-10 NOTE — Telephone Encounter (Signed)
 Please call and schedule fasting lab appointment prior to her CPE.  Future orders in Epic.

## 2024-12-10 NOTE — Telephone Encounter (Signed)
 Copied from CRM #[id number]. Topic: Clinical - Request for Lab/Test Order >> Dec 10, 2024  9:12 AM Franky GRADE wrote: Reason for CRM: Patient scheduled her annual physical on 03/03/2024 and would like to have labs done prior; however, no active orders.

## 2024-12-18 ENCOUNTER — Other Ambulatory Visit: Payer: Self-pay | Admitting: Family Medicine

## 2025-03-03 ENCOUNTER — Encounter: Admitting: Family Medicine

## 2025-03-24 ENCOUNTER — Other Ambulatory Visit

## 2025-03-31 ENCOUNTER — Ambulatory Visit: Admitting: Family Medicine

## 2025-08-12 ENCOUNTER — Ambulatory Visit
